# Patient Record
Sex: Female | Born: 2000 | Race: White | Hispanic: No | Marital: Single | Smoking: Never smoker
Health system: Southern US, Community
[De-identification: ages and names within clinical notes are randomized; demographics above are authoritative.]

## PROBLEM LIST (undated history)

## (undated) DIAGNOSIS — F840 Autistic disorder: Secondary | ICD-10-CM

## (undated) DIAGNOSIS — R569 Unspecified convulsions: Secondary | ICD-10-CM

## (undated) DIAGNOSIS — G809 Cerebral palsy, unspecified: Secondary | ICD-10-CM

## (undated) DIAGNOSIS — Z87898 Personal history of other specified conditions: Secondary | ICD-10-CM

## (undated) DIAGNOSIS — I619 Nontraumatic intracerebral hemorrhage, unspecified: Secondary | ICD-10-CM

## (undated) DIAGNOSIS — R51 Headache: Secondary | ICD-10-CM

## (undated) DIAGNOSIS — F419 Anxiety disorder, unspecified: Secondary | ICD-10-CM

## (undated) HISTORY — DX: Headache: R51

## (undated) HISTORY — DX: Autistic disorder: F84.0

## (undated) HISTORY — DX: Personal history of other specified conditions: Z87.898

## (undated) HISTORY — DX: Nontraumatic intracerebral hemorrhage, unspecified: I61.9

## (undated) HISTORY — PX: BOTOX INJECTION: SHX5754

## (undated) HISTORY — DX: Unspecified convulsions: R56.9

## (undated) HISTORY — DX: Anxiety disorder, unspecified: F41.9

---
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
## 2022-01-05 ENCOUNTER — Other Ambulatory Visit (INDEPENDENT_AMBULATORY_CARE_PROVIDER_SITE_OTHER): Payer: Self-pay | Admitting: Family

## 2022-01-05 DIAGNOSIS — F411 Generalized anxiety disorder: Secondary | ICD-10-CM

## 2022-03-11 NOTE — Progress Notes (Signed)
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 [name] and has remained seizure free since July 2013. She has a Information systems manager. [name] for anxiety, as well as Clonidine and Trazodone for insomnia.   Today's concerns: Has remained seizure free Has frequent headaches and stomach pains, thought to be stress related. Has upcoming appointment with GI for evaluation In first year of college. 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 Bonnie Simon has been otherwise generally healthy since she was last seen. No health concerns today other than previously mentioned.  Review of systems: Please see HPI for neurologic and other pertinent review of systems. Otherwise all other systems were reviewed and were negative.  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   Partial epilepsy with  impairment of consciousness (HCC) 11/05/2012   Episodic tension-type headache, not intractable 11/05/2012   Expressive language disorder 11/05/2012   Delayed milestones 11/05/2012   Neonatal intraventricular hemorrhage 11/05/2012   Encounter for long-term (current) use of high-risk medication 11/05/2012     Past Medical History:  Diagnosis Date   Anxiety    Headache(784.0)    History of prematurity 2004   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 Kennon Portela at [facility]     Family history: family history includes Heart Problems in her paternal grandfather.   Social history: Social History   Socioeconomic History   Marital status: Single    Spouse name: Not on file   Number of children: Not on file   Years of education: Not on file   Highest education level: Not on file  Occupational History   Not on file  Tobacco Use   Smoking status: Never    Passive exposure: Yes   Smokeless tobacco: Never  Substance and Sexual Activity   Alcohol use: No   Drug use: No   Sexual activity: Never  Other Topics Concern   Not on file  Social History Narrative   Page is a12th grade student.   She attends [employer].    Bonnie Simon enjoys playing marching band, basketball, soccer, and watching TV.   Demetra lives with her parents. She has an adult sister that does not live at home.   Social Determinants of Health   Financial Resource Strain: Not on file  Food Insecurity: Not on file  Transportation Needs: Not on file  Physical Activity: Not on file  Stress: Not on file  Social Connections: Not on file  Intimate Partner Violence: Not on file    Past/failed meds:  Allergies: Allergies  Allergen Reactions   Amoxicillin Rash    Immunizations: Immunization  History  Administered Date(s) Administered   PFIZER(Purple Top)SARS-COV-2 Vaccination 10/16/2019    Diagnostics/Screenings: Copied from previous record: 09/13/2013 - rEEG - This is a abnormal record with the patient awake.  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  Bonnie Simon, M.D.   05/19/2007 - rEEG - Abnormal EEG on the basis of slowing over the right hemisphere, which is indicative of underlying structural and/or vascular  abnormality and would correlate with the patient's left hemiparesis. This may also be a result of a postictal change from a right brain  signature seizure. Bonnie Simon, M.D.   07/22/2006 - CT Head wo contrast - Patient had a remote periventricular hemorrhage with subsequent dilatation of the right lateral ventricle appearring similar to prior MR. The present exam is motion degraded without obvious intracranial hemorrhage or new mass identified.  Followup MR without motion may be considered for further delineation for investigation of seizure focus when the patient is able to cooperate or be properly sedated.    01/2017 - diagnosis of autism spectrum disorder by Dr Lurline Hare.  Physical Exam: BP 112/80 (BP Location: Right Arm, Patient Position: Sitting, Cuff Size: Normal)   Pulse 80   Ht 4' 10.86" (1.495 m)   Wt 110 lb 12.8 oz (50.3 kg)   LMP 02/28/2022 (Exact Date)   BMI 22.49 kg/m   General: Well developed, well nourished adolescent girl, seated on exam table, in no evident distress Head: Head normocephalic and atraumatic.  Oropharynx benign. Neck: Supple Cardiovascular: Regular rate and rhythm, no murmurs Respiratory: Breath sounds clear to auscultation Musculoskeletal: Mild neuromuscular scoliosis. Has left hemiparesis with hemiatrophy Skin: No rashes or neurocutaneous lesions  Neurologic Exam Mental Status: Awake and fully alert.  Oriented to place and time.   Recent and remote memory intact.  Attention span, concentration, and fund of knowledge appropriate.  Tearful and upset during discussion regarding her career goals. Cranial Nerves: Fundoscopic exam reveals sharp disc margins.  Pupils equal, briskly reactive to light.  Extraocular movements full without nystagmus. Hearing intact and symmetric to whisper.  Facial sensation intact.  Face tongue, palate move normally and symmetrically.  Motor: Normal bulk, tone and strength on the right, hemiparesis on the left with the left hand fisted. She is able to use the left hand and arm has a helper hand. Sensory: Intact to touch and temperature in all extremities.  Coordination: Rapid alternating movements and finger to nose normal on the right, clumsy on the left. Romberg negative. Gait and Station: Arises from chair without difficulty.  Stance is normal. Gait demonstrates mild left hemiparetic gait with decreased left arm swing and slight circumduction of the left leg. Able to heel, toe and tandem walk with mild clumsiness.  Reflexes: diminished on the right, 1+ on the right  Impression: Generalized convulsive epilepsy ([place]) - Plan: carbamazepine (TEGRETOL) 100 MG chewable tablet  Partial epilepsy with impairment of consciousness ([place]) - Plan: carbamazepine (TEGRETOL) 100 MG chewable tablet  Generalized anxiety disorder  Insomnia due to anxiety and fear - Plan: cloNIDine (CATAPRES) 0.1 MG tablet, traZODone (DESYREL) 50 MG tablet  Congenital hemiplegia (HCC)  Episodic tension-type headache,  not intractable  Autism spectrum disorder  Intraventricular hemorrhage of newborn, grade IV  Intraparenchymal hemorrhage of brain [facility])    Recommendations for plan of care: The patient's previous Epic records were reviewed. No recent diagnostic studies to be reviewed with the patient.  Plan until next visit: Continue medications as prescribed  I will investigate physical requirements for the health care  careers that you are interested in and call you in a week or so.  Be sure to keep the appointment with GI as scheduled Call if seizures occur or if you have any questions. Return in about 6 months (around 09/10/2022).  The medication list was reviewed and reconciled. No changes were made in the prescribed medications today. A complete medication list was provided to the patient.  Allergies as of 03/12/2022       Reactions   Amoxicillin Rash        Medication List        Accurate as of March 12, 2022 11:59 PM. If you have any questions, ask your nurse or doctor.          carbamazepine 100 MG chewable tablet Commonly known as: TEGRETOL CHEW 2 TABLETS BY MOUTH IN THE MORNING, 2 TABLETS IN THE AFTERNOON, AND 3 TABLETS AT BEDTIME.   cloNIDine 0.1 MG tablet Commonly known as: CATAPRES TAKE 1 AND 1/2 TABLETS BY MOUTH 30 MINUTES BEFORE BEDTIME.   diazepam 10 MG Gel Commonly known as: DIASTAT ACUDIAL Place 7.5 mg rectally once. Reported on 07/02/2015   FLUoxetine 20 MG/5ML solution Commonly known as: PROZAC TAKE 4 MLS BY MOUTH ONCE DAILY.   ibuprofen 200 MG tablet Commonly known as: ADVIL Take 1 tablet at onset of headache, may repeat in 6 hours if needed.   Melatonin 10 MG Tabs Take 1 tablet at bedtime   traZODone 50 MG tablet Commonly known as: DESYREL TAKE 1 & 1/2 TABLETS BY MOUTH 30 MINS BEFORE BEDTIME.      Total time spent with the patient was 40 minutes, of which 50% or more was spent in counseling and coordination of care.  Elveria Rising NP-C [place] [facility] [address], [address] [place], [place] [address] Ph. [phone] Fax [phone]

## 2022-03-12 ENCOUNTER — Encounter (INDEPENDENT_AMBULATORY_CARE_PROVIDER_SITE_OTHER): Payer: Self-pay | Admitting: Family

## 2022-03-12 ENCOUNTER — Ambulatory Visit (INDEPENDENT_AMBULATORY_CARE_PROVIDER_SITE_OTHER): Payer: BC Managed Care – PPO | Admitting: Family

## 2022-03-12 VITALS — BP 112/80 | HR 80 | Ht 58.86 in | Wt 110.8 lb

## 2022-03-12 DIAGNOSIS — I619 Nontraumatic intracerebral hemorrhage, unspecified: Secondary | ICD-10-CM

## 2022-03-12 DIAGNOSIS — G40209 Localization-related (focal) (partial) symptomatic epilepsy and epileptic syndromes with complex partial seizures, not intractable, without status epilepticus: Secondary | ICD-10-CM | POA: Diagnosis not present

## 2022-03-12 DIAGNOSIS — F5105 Insomnia due to other mental disorder: Secondary | ICD-10-CM | POA: Diagnosis not present

## 2022-03-12 DIAGNOSIS — F840 Autistic disorder: Secondary | ICD-10-CM

## 2022-03-12 DIAGNOSIS — G808 Other cerebral palsy: Secondary | ICD-10-CM

## 2022-03-12 DIAGNOSIS — G40309 Generalized idiopathic epilepsy and epileptic syndromes, not intractable, without status epilepticus: Secondary | ICD-10-CM | POA: Diagnosis not present

## 2022-03-12 DIAGNOSIS — F411 Generalized anxiety disorder: Secondary | ICD-10-CM | POA: Diagnosis not present

## 2022-03-12 DIAGNOSIS — G44219 Episodic tension-type headache, not intractable: Secondary | ICD-10-CM

## 2022-03-12 DIAGNOSIS — F409 Phobic anxiety disorder, unspecified: Secondary | ICD-10-CM

## 2022-03-14 ENCOUNTER — Encounter (INDEPENDENT_AMBULATORY_CARE_PROVIDER_SITE_OTHER): Payer: Self-pay | Admitting: Family

## 2022-03-14 MED ORDER — TRAZODONE HCL 50 MG PO TABS: ORAL_TABLET | ORAL | 5 refills | Status: DC

## 2022-03-14 MED ORDER — CLONIDINE HCL 0.1 MG PO TABS: ORAL_TABLET | ORAL | 1 refills | Status: DC

## 2022-03-14 MED ORDER — CARBAMAZEPINE 100 MG PO CHEW: CHEWABLE_TABLET | ORAL | 5 refills | Status: DC

## 2022-03-14 NOTE — Patient Instructions (Signed)
It was a pleasure to see you today!  Instructions for you until your next appointment are as follows: Continue taking your medications as prescribed I will do some investigation about physical requirements for the careers you are interested in and will call you in a week or so.  Be sure to keep the GI appointment for your stomach pain Please sign up for MyChart if you have not done so. Please plan to return for follow up in 6 months or sooner if needed.   Feel free to contact our office during normal business hours at [phone] with questions or concerns. If there is no answer or the call is outside business hours, please leave a message and our clinic staff will call you back within the next business day.  If you have an urgent concern, please stay on the line for our after-hours answering service and ask for the on-call neurologist.     I also encourage you to use MyChart to communicate with me more directly. If you have not yet signed up for MyChart within [facility], the front desk staff can help you. However, please note that this inbox is NOT monitored on nights or weekends, and response can take up to 2 business days.  Urgent matters should be discussed with the on-call pediatric neurologist.   At [facility], we are committed to providing exceptional care. You will receive a patient satisfaction survey through text or email regarding your visit today. Your opinion is important to me. Comments are appreciated.

## 2022-03-18 DIAGNOSIS — R109 Unspecified abdominal pain: Secondary | ICD-10-CM | POA: Diagnosis not present

## 2022-03-18 DIAGNOSIS — R12 Heartburn: Secondary | ICD-10-CM | POA: Diagnosis not present

## 2022-03-19 DIAGNOSIS — R109 Unspecified abdominal pain: Secondary | ICD-10-CM | POA: Diagnosis not present

## 2022-04-02 DIAGNOSIS — N200 Calculus of kidney: Secondary | ICD-10-CM | POA: Diagnosis not present

## 2022-04-03 ENCOUNTER — Other Ambulatory Visit (INDEPENDENT_AMBULATORY_CARE_PROVIDER_SITE_OTHER): Payer: Self-pay | Admitting: Family

## 2022-04-03 DIAGNOSIS — F409 Phobic anxiety disorder, unspecified: Secondary | ICD-10-CM

## 2022-04-08 DIAGNOSIS — N200 Calculus of kidney: Secondary | ICD-10-CM | POA: Diagnosis not present

## 2022-04-15 DIAGNOSIS — N200 Calculus of kidney: Secondary | ICD-10-CM | POA: Diagnosis not present

## 2022-04-16 DIAGNOSIS — R109 Unspecified abdominal pain: Secondary | ICD-10-CM | POA: Diagnosis not present

## 2022-04-22 DIAGNOSIS — N200 Calculus of kidney: Secondary | ICD-10-CM | POA: Diagnosis not present

## 2022-05-05 DIAGNOSIS — N200 Calculus of kidney: Secondary | ICD-10-CM | POA: Diagnosis not present

## 2022-05-12 DIAGNOSIS — R12 Heartburn: Secondary | ICD-10-CM | POA: Diagnosis not present

## 2022-05-12 DIAGNOSIS — R1030 Lower abdominal pain, unspecified: Secondary | ICD-10-CM | POA: Diagnosis not present

## 2022-05-24 DIAGNOSIS — J069 Acute upper respiratory infection, unspecified: Secondary | ICD-10-CM | POA: Diagnosis not present

## 2022-07-01 DIAGNOSIS — N200 Calculus of kidney: Secondary | ICD-10-CM | POA: Diagnosis not present

## 2022-07-07 ENCOUNTER — Ambulatory Visit: Payer: BC Managed Care – PPO | Admitting: Internal Medicine

## 2022-07-07 ENCOUNTER — Encounter: Payer: Self-pay | Admitting: Internal Medicine

## 2022-07-07 VITALS — BP 132/84 | HR 95 | Ht 58.5 in | Wt 109.4 lb

## 2022-07-07 DIAGNOSIS — Z7689 Persons encountering health services in other specified circumstances: Secondary | ICD-10-CM | POA: Diagnosis not present

## 2022-07-07 DIAGNOSIS — G40309 Generalized idiopathic epilepsy and epileptic syndromes, not intractable, without status epilepticus: Secondary | ICD-10-CM | POA: Diagnosis not present

## 2022-07-07 DIAGNOSIS — F5105 Insomnia due to other mental disorder: Secondary | ICD-10-CM | POA: Diagnosis not present

## 2022-07-07 DIAGNOSIS — F411 Generalized anxiety disorder: Secondary | ICD-10-CM | POA: Diagnosis not present

## 2022-07-07 DIAGNOSIS — Z1159 Encounter for screening for other viral diseases: Secondary | ICD-10-CM | POA: Diagnosis not present

## 2022-07-07 DIAGNOSIS — K219 Gastro-esophageal reflux disease without esophagitis: Secondary | ICD-10-CM

## 2022-07-07 DIAGNOSIS — K589 Irritable bowel syndrome without diarrhea: Secondary | ICD-10-CM | POA: Insufficient documentation

## 2022-07-07 DIAGNOSIS — F409 Phobic anxiety disorder, unspecified: Secondary | ICD-10-CM

## 2022-07-07 DIAGNOSIS — Z114 Encounter for screening for human immunodeficiency virus [HIV]: Secondary | ICD-10-CM | POA: Diagnosis not present

## 2022-07-07 DIAGNOSIS — N200 Calculus of kidney: Secondary | ICD-10-CM

## 2022-07-07 NOTE — Assessment & Plan Note (Signed)
Questionable symptoms of IBS, she has uncontrolled anxiety, has chronic abdominal pain and had diarrhea - resolved now Was taking Bentyl, but was not effective, DC Bentyl as it can lead to worsening of constipation Followed by GI

## 2022-07-07 NOTE — Assessment & Plan Note (Signed)
Uncontrolled with Prozac and trazodone currently Needs to take Prozac in AM to avoid insomnia from SSRI Currently managed by pediatric neurology

## 2022-07-07 NOTE — Progress Notes (Signed)
New Patient Office Visit  Subjective:  Patient ID: Bonnie Simon, female    DOB: 07-12-2000  Age: 22 y.o. MRN: [id number]  CC:  Chief Complaint  Patient presents with   Establish Care    HPI Bonnie Simon is a 22 y.o. female with past medical history of generalized convulsive epilepsy, neonatal intraventricular hemorrhage, GAD, GERD and nephrolithiasis who presents for establishing care.  She you used to see pediatrics clinic for her routine medical care.  She sees pediatric neurology for history of generalized convulsive epilepsy and GAD.  She is currently on Tegretol for epilepsy.  She takes Prozac for GAD.  She takes clonidine and trazodone for insomnia.  She reports chronic and anxiety.  She is currently enrolled in radiology tech school.  She still has insomnia, has difficulty initiating and maintaining sleep.  GERD and abdominal pain: She has had GI evaluation for chronic abdominal pain and bloating.  She has been taking omeprazole, which has resolved her episodes of epigastric/chest pain now.  Denies any dysphagia or odynophagia currently.  She has intermittent nausea, but denies vomiting.  She was also given Bentyl for abdominal pain.  She had diarrhea, which was chronic, but has resolved now.  She denies any melena or hematochezia.  She reports no improvement with Bentyl today.  She has had back discomfort when she has abdominal pain as well.  Denies any injury.  She had CT abdomen done in 02/24, which also showed bilateral nephrolithiasis.  She has seen urologist for it.  Denies any dysuria, hematuria or urinary hesitancy or resistance currently.     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 Kennon Portela at [facility]    Family History  Problem Relation Age of Onset   Heart Problems Paternal Grandfather        Died at 35    Social History   Socioeconomic History   Marital status: Single    Spouse name: Not on file   Number of children: Not on file   Years of education: Not on file   Highest education level: Not on file  Occupational History   Not on file  Tobacco Use   Smoking status: Never    Passive exposure: Yes   Smokeless tobacco: Never  Substance and Sexual Activity   Alcohol use: No   Drug use: No   Sexual activity: Never  Other Topics Concern   Not on file  Social History Narrative   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   She attends [employer].    Bonnie Simon enjoys playing marching band, basketball, soccer, and watching TV.   Bonnie Simon lives with her parents. She has an adult sister that does not live at home.   Social Determinants of Health   Financial Resource Strain: Not on file  Food Insecurity: Not on file  Transportation Needs: Not on file  Physical Activity: Not on file  Stress: Not on file  Social Connections: Not on file  Intimate Partner Violence: Not on file    ROS Review of Systems  Constitutional:  Negative for chills and fever.  HENT:  Negative for  congestion, sinus pressure, sinus pain and sore throat.   Eyes:  Negative for pain and discharge.  Respiratory:  Negative for cough and shortness of breath.   Cardiovascular:  Negative for chest pain and palpitations.  Gastrointestinal:  Positive for abdominal pain and nausea. Negative for diarrhea and vomiting.  Endocrine: Negative for polydipsia and polyuria.  Genitourinary:  Negative for dysuria and hematuria.  Musculoskeletal:  Positive for myalgias. Negative for neck pain and neck stiffness.  Skin:  Negative for rash.  Neurological:  Negative for dizziness and weakness.  Psychiatric/Behavioral:  Positive for sleep disturbance. Negative for  agitation and behavioral problems. The patient is nervous/anxious.     Objective:   Today's Vitals: BP 132/84 (BP Location: Right Arm, Patient Position: Sitting, Cuff Size: Normal)   Pulse 95   Ht 4' 10.5" (1.486 m)   Wt 109 lb 6.4 oz (49.6 kg)   SpO2 97%   BMI 22.48 kg/m   Physical Exam Vitals reviewed.  Constitutional:      General: She is not in acute distress.    Appearance: She is not diaphoretic.  HENT:     Head: Normocephalic and atraumatic.     Nose: Nose normal.     Mouth/Throat:     Mouth: Mucous membranes are moist.  Eyes:     General: No scleral icterus.    Extraocular Movements: Extraocular movements intact.  Cardiovascular:     Rate and Rhythm: Normal rate and regular rhythm.     Pulses: Normal pulses.     Heart sounds: No murmur heard. Pulmonary:     Breath sounds: Normal breath sounds. No wheezing or rales.  Abdominal:     Palpations: Abdomen is soft.     Tenderness: There is no abdominal tenderness.  Musculoskeletal:     Cervical back: Neck supple. No tenderness.     Right lower leg: No edema.     Left lower leg: No edema.  Skin:    General: Skin is warm.     Findings: No rash.  Neurological:     General: No focal deficit present.     Mental Status: She is alert and oriented to person, place, and time.  Psychiatric:        Mood and Affect: Mood normal.        Behavior: Behavior normal.     Assessment & Plan:   Problem List Items Addressed This Visit       Digestive   Gastroesophageal reflux disease without esophagitis    Well controlled with omeprazole now Has seen GI specialist at [facility]      Relevant Medications   omeprazole (PRILOSEC) 40 MG capsule   Irritable bowel syndrome    Questionable symptoms of IBS, she has uncontrolled anxiety, has chronic abdominal pain and had diarrhea - resolved now Was taking Bentyl, but was not effective, DC Bentyl as it can lead to worsening of constipation Followed by GI      Relevant  Medications   omeprazole (PRILOSEC) 40 MG capsule     Nervous and Auditory   Generalized convulsive epilepsy (HCC) - Primary    Well controlled with Tegretol Denies any recent seizure like activity Followed by pediatric neurology, she prefers to stay with her current neurologist.       Relevant Orders   CBC   Basic Metabolic Panel (BMET)     Genitourinary   Nephrolithiasis    Has bilateral nephrolithiasis, likely incidental finding Has seen urologist at [facility] Needs to  improve hydration and follow low-salt diet        Other   Generalized anxiety disorder    Uncontrolled with Prozac and trazodone currently Needs to take Prozac in AM to avoid insomnia from SSRI Currently managed by pediatric neurology      Relevant Orders   TSH + free T4   Insomnia due to anxiety and fear    On trazodone 75 mg qHS and clonidine 0.15 mg qHS      Other Visit Diagnoses     Encounter to establish care       Screening for HIV (human immunodeficiency virus)       Relevant Orders   HIV antibody (with reflex)   Need for hepatitis C screening test       Relevant Orders   Hepatitis C Antibody       Outpatient Encounter Medications as of 07/07/2022  Medication Sig   carbamazepine (TEGRETOL) 100 MG chewable tablet CHEW 2 TABLETS BY MOUTH IN THE MORNING, 2 TABLETS IN THE AFTERNOON, AND 3 TABLETS AT BEDTIME.   cloNIDine (CATAPRES) 0.1 MG tablet TAKE 1 AND 1/2 TABLETS BY MOUTH 30 MINUTES BEFORE BEDTIME.   FLUoxetine (PROZAC) 20 MG/5ML solution TAKE 4 MLS BY MOUTH ONCE DAILY.   ibuprofen (ADVIL) 200 MG tablet Take 1 tablet at onset of headache, may repeat in 6 hours if needed.   Melatonin 10 MG TABS Take 1 tablet at bedtime   omeprazole (PRILOSEC) 40 MG capsule Take 40 mg by mouth every morning.   traZODone (DESYREL) 50 MG tablet TAKE 1 & 1/2 TABLETS BY MOUTH 30 MINS BEFORE BEDTIME.   [DISCONTINUED] dicyclomine (BENTYL) 10 MG capsule TAKE 1 CAPSULE BY MOUTH 30 MINUTES BEFORE MEALS AND AT  BEDTIME.   [DISCONTINUED] diazepam (DIASTAT ACUDIAL) 10 MG GEL Place 7.5 mg rectally once. Reported on 07/02/2015 (Patient not taking: Reported on 07/07/2022)   No facility-administered encounter medications on file as of 07/07/2022.    Follow-up: Return in about 6 months (around 01/07/2023) for GAD and seizures.   Anabel Halon, MD

## 2022-07-07 NOTE — Assessment & Plan Note (Signed)
Has bilateral nephrolithiasis, likely incidental finding Has seen urologist at [facility] Needs to improve hydration and follow low-salt diet

## 2022-07-07 NOTE — Assessment & Plan Note (Signed)
Well controlled with Tegretol Denies any recent seizure like activity Followed by pediatric neurology, she prefers to stay with her current neurologist.

## 2022-07-07 NOTE — Assessment & Plan Note (Signed)
On trazodone 75 mg qHS and clonidine 0.15 mg qHS

## 2022-07-07 NOTE — Patient Instructions (Signed)
Please stop taking Bentyl.  Please take Fluoxetine in the morning.  Please continue taking other medications as prescribed.  Please maintain simple sleep hygiene. - Maintain dark and non-noisy environment in the bedroom. - Please use the bedroom for sleep and sexual activity only. - Do not use electronic devices in the bedroom. - Please take dinner at least 2 hours before bedtime. - Please avoid caffeinated products in the evening, including coffee, soft drinks. - Please try to maintain the regular sleep-wake cycle - Go to bed and wake up at the same time.

## 2022-07-07 NOTE — Assessment & Plan Note (Addendum)
Well controlled with omeprazole now Has seen GI specialist at [facility]

## 2022-07-08 LAB — CBC
Hematocrit: 40.3 % (ref 34.0–46.6)
Hemoglobin: 14.1 g/dL (ref 11.1–15.9)
MCH: 33.4 pg — ABNORMAL HIGH (ref 26.6–33.0)
MCHC: 35 g/dL (ref 31.5–35.7)
MCV: 96 fL (ref 79–97)
Platelets: 259 10*3/uL (ref 150–450)
RBC: 4.22 x10E6/uL (ref 3.77–5.28)
RDW: 11.6 % — ABNORMAL LOW (ref 11.7–15.4)
WBC: 5.5 10*3/uL (ref 3.4–10.8)

## 2022-07-08 LAB — BASIC METABOLIC PANEL
BUN/Creatinine Ratio: 10 (ref 9–23)
BUN: 8 mg/dL (ref 6–20)
CO2: 25 mmol/L (ref 20–29)
Calcium: 9.8 mg/dL (ref 8.7–10.2)
Chloride: 101 mmol/L (ref 96–106)
Creatinine, Ser: 0.79 mg/dL (ref 0.57–1.00)
Glucose: 96 mg/dL (ref 70–99)
Potassium: 4.4 mmol/L (ref 3.5–5.2)
Sodium: 139 mmol/L (ref 134–144)
eGFR: 110 mL/min/{1.73_m2} (ref >59)

## 2022-07-08 LAB — HEPATITIS C ANTIBODY: Hep C Virus Ab: NONREACTIVE

## 2022-07-08 LAB — TSH+FREE T4
Free T4: 0.93 ng/dL (ref 0.93–1.60)
TSH: 1.48 u[IU]/mL (ref 0.450–4.500)

## 2022-07-08 LAB — HIV ANTIBODY (ROUTINE TESTING W REFLEX): HIV Screen 4th Generation wRfx: NONREACTIVE

## 2022-07-14 DIAGNOSIS — K219 Gastro-esophageal reflux disease without esophagitis: Secondary | ICD-10-CM | POA: Diagnosis not present

## 2022-07-14 DIAGNOSIS — E739 Lactose intolerance, unspecified: Secondary | ICD-10-CM | POA: Diagnosis not present

## 2022-07-14 DIAGNOSIS — R1030 Lower abdominal pain, unspecified: Secondary | ICD-10-CM | POA: Diagnosis not present

## 2022-08-04 DIAGNOSIS — N200 Calculus of kidney: Secondary | ICD-10-CM | POA: Diagnosis not present

## 2022-09-19 DIAGNOSIS — N200 Calculus of kidney: Secondary | ICD-10-CM | POA: Diagnosis not present

## 2022-09-21 ENCOUNTER — Other Ambulatory Visit (INDEPENDENT_AMBULATORY_CARE_PROVIDER_SITE_OTHER): Payer: Self-pay | Admitting: Family

## 2022-09-21 DIAGNOSIS — F5105 Insomnia due to other mental disorder: Secondary | ICD-10-CM

## 2022-09-29 DIAGNOSIS — N200 Calculus of kidney: Secondary | ICD-10-CM | POA: Diagnosis not present

## 2022-10-03 ENCOUNTER — Other Ambulatory Visit (INDEPENDENT_AMBULATORY_CARE_PROVIDER_SITE_OTHER): Payer: Self-pay | Admitting: Family

## 2022-10-03 DIAGNOSIS — G40209 Localization-related (focal) (partial) symptomatic epilepsy and epileptic syndromes with complex partial seizures, not intractable, without status epilepticus: Secondary | ICD-10-CM

## 2022-10-03 DIAGNOSIS — G40309 Generalized idiopathic epilepsy and epileptic syndromes, not intractable, without status epilepticus: Secondary | ICD-10-CM

## 2022-10-06 NOTE — Telephone Encounter (Signed)
Last OV 03/12/2022 Next OV 10/16/2022 Last Rx written 03/14/2022 with 5 refills last filled 09/02/2022

## 2022-10-13 ENCOUNTER — Other Ambulatory Visit (INDEPENDENT_AMBULATORY_CARE_PROVIDER_SITE_OTHER): Payer: Self-pay | Admitting: Family

## 2022-10-13 DIAGNOSIS — F409 Phobic anxiety disorder, unspecified: Secondary | ICD-10-CM

## 2022-10-16 ENCOUNTER — Encounter (INDEPENDENT_AMBULATORY_CARE_PROVIDER_SITE_OTHER): Payer: Self-pay | Admitting: Family

## 2022-10-16 ENCOUNTER — Ambulatory Visit (INDEPENDENT_AMBULATORY_CARE_PROVIDER_SITE_OTHER): Payer: BC Managed Care – PPO | Admitting: Family

## 2022-10-16 VITALS — BP 120/78 | HR 68 | Ht 58.82 in | Wt 102.4 lb

## 2022-10-16 DIAGNOSIS — G40209 Localization-related (focal) (partial) symptomatic epilepsy and epileptic syndromes with complex partial seizures, not intractable, without status epilepticus: Secondary | ICD-10-CM

## 2022-10-16 DIAGNOSIS — F840 Autistic disorder: Secondary | ICD-10-CM

## 2022-10-16 DIAGNOSIS — G808 Other cerebral palsy: Secondary | ICD-10-CM | POA: Diagnosis not present

## 2022-10-16 DIAGNOSIS — G40309 Generalized idiopathic epilepsy and epileptic syndromes, not intractable, without status epilepticus: Secondary | ICD-10-CM | POA: Diagnosis not present

## 2022-10-16 DIAGNOSIS — N200 Calculus of kidney: Secondary | ICD-10-CM

## 2022-10-16 DIAGNOSIS — Z5689 Other problems related to employment: Secondary | ICD-10-CM

## 2022-10-16 DIAGNOSIS — G44219 Episodic tension-type headache, not intractable: Secondary | ICD-10-CM

## 2022-10-16 DIAGNOSIS — F411 Generalized anxiety disorder: Secondary | ICD-10-CM

## 2022-10-16 NOTE — Progress Notes (Signed)
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 [name] and has remained seizure free since July 2013. She has a Information systems manager. [name] for anxiety, as well as Clonidine and Trazodone for insomnia.   Today's concerns: She has remained seizure free She has been diagnosed with bilateral renal stones and will be having surgery in September to remove them. Her provider has suggested that Carbamazepine may be responsible for stone formation.  Bonnie Simon is in college and is struggling with career choices. 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 She is enrolled in a radiology program and is unhappy in this area. She is frustrated with limitations related to the left hemiplegia. Mom is also concerned about social limitations related to her anxiety and her autism.  Bonnie Simon has been otherwise generally healthy since she was last seen. No health concerns today other than previously mentioned.  Review of systems: Please see HPI for neurologic and other pertinent review of systems. Otherwise all other systems were reviewed and were negative.  Problem List: Patient Active Problem List   Diagnosis Date Noted   Nephrolithiasis 07/07/2022   Gastroesophageal  reflux disease without esophagitis 07/07/2022   Irritable bowel syndrome 07/07/2022   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   Autism spectrum disorder 02/20/2017   Generalized anxiety disorder 07/05/2014   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   Partial epilepsy with impairment of consciousness (HCC) 11/05/2012   Episodic tension-type headache, not intractable 11/05/2012   Neonatal intraventricular hemorrhage 11/05/2012     Past Medical History:  Diagnosis Date   Anxiety    Autism    Delayed milestones 11/05/2012   Headache(784.0)    History of prematurity 2004   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 Kennon Portela at [facility]    Family history: family history includes Heart Problems in her paternal grandfather.   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   She attends [employer].    Bonnie Simon Agee  enjoys playing marching band, basketball, soccer, and watching TV.   Bonnie Simon lives with her parents. She has an adult sister that does not live at home.   Social Determinants of Health   Financial Resource Strain: Not on file  Food Insecurity: Not on file  Transportation Needs: Not on file  Physical Activity: Not  on file  Stress: Not on file  Social Connections: Unknown (02/18/2022)   Received from [facility], [facility]   Social Network    Social Network: Not on file  Intimate Partner Violence: Unknown (02/18/2022)   Received from [facility], [facility]   HITS    Physically Hurt: Not on file    Insult or Talk Down To: Not on file    Threaten Physical Harm: Not on file    Scream or Curse: Not on file    Past/failed meds:  Allergies: Allergies  Allergen Reactions   Amoxicillin Rash    Immunizations: Immunization History  Administered Date(s) Administered   PFIZER(Purple Top)SARS-COV-2 Vaccination 10/16/2019    Diagnostics/Screenings: Copied from previous record: 09/13/2013 - rEEG - This is a abnormal record with the patient awake.  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  [name], M.D.   05/19/2007 - rEEG - Abnormal EEG on the basis of slowing over the right hemisphere, which is indicative of underlying structural and/or vascular  abnormality and would correlate with the patient's left hemiparesis. This may also be a result of a postictal change from a right brain  signature seizure. [name], M.D.   07/22/2006 - CT Head wo contrast - Patient had a remote periventricular hemorrhage with subsequent dilatation of the right lateral ventricle appearring similar to prior MR. The present exam is motion degraded without obvious intracranial hemorrhage or new mass identified.  Followup MR without motion may be considered for further delineation for investigation of seizure focus when the patient is able to cooperate or be properly sedated.    01/2017 - diagnosis of autism spectrum disorder by Dr Reggy Eye.  Physical Exam: BP 120/78   Pulse 68   Ht 4' 10.82" (1.494 m)   Wt 102 lb 6.4 oz (46.4 kg)   BMI 20.81 kg/m   General: Well developed, well nourished young woman, seated  on exam table, in no evident distress Head: Head normocephalic and atraumatic.  Oropharynx benign. Neck: Supple Cardiovascular: Regular rate and rhythm, no murmurs Respiratory: Breath sounds clear to auscultation Musculoskeletal: Mild neuromuscular scoliosis. Has left hemiparesis with hemiatrophy.  Skin: No rashes or neurocutaneous lesions  Neurologic Exam Mental Status: Awake and fully alert.  Oriented to place and time.  Recent and remote memory intact.  Attention span, concentration, and fund of knowledge appropriate.  Tearful and angry during the discussion about her career goals.  Cranial Nerves: Fundoscopic exam reveals sharp disc margins.  Pupils equal, briskly reactive to light.  Extraocular movements full without nystagmus. Hearing intact and symmetric to whisper.  Facial sensation intact.  Face tongue, palate move normally and symmetrically. Shoulder shrug normal Motor: Normal bulk, tone and strength on the right. Hemiparesis on the left with the hand fisted. Is able to use the left arm and hand as a helper hand.  Sensory: Intact to touch and temperature in all extremities.  Coordination: Rapid alternating movements normal on the right, clumsy on the left.  Gait and Station: Arises from chair without difficulty.  Stance is normal. Gait demonstrates mild left hemiparetic gait.  Impression:  Partial epilepsy with impairment of consciousness (HCC)  Congenital hemiplegia (HCC)  Generalized convulsive epilepsy (HCC)  Episodic tension-type headache, not intractable  Intraventricular hemorrhage of newborn, grade IV  Generalized anxiety disorder  Autism spectrum disorder  Nephrolithiasis  Career choice problem   Recommendations for plan of care: The patient's previous Epic records were reviewed. No recent diagnostic studies to be reviewed with the patient.  Plan until next visit: Continue medications as prescribed for now. I am reluctant to make changes in the medication at  this time since you have been seizure free for several years.  Call for questions or concerns Return in about 6 months (around 04/18/2023).  The medication list was reviewed and reconciled. No changes were made in the prescribed medications today. A complete medication list was provided to the patient.  Allergies as of 10/16/2022       Reactions   Amoxicillin Rash        Medication List        Accurate as of October 16, 2022 11:59 PM. If you have any questions, ask your nurse or doctor.          carbamazepine 100 MG chewable tablet Commonly known as: TEGRETOL CHEW 2 TABLETS BY MOUTH IN THE MORNING, 2 TABLETS IN THE AFTERNOON, AND 3 TABLETS AT BEDTIME.   cloNIDine 0.1 MG tablet Commonly known as: CATAPRES TAKE 1 AND 1/2 TABLETS BY MOUTH 30 MINUTES BEFORE BEDTIME.   FLUoxetine 20 MG/5ML solution Commonly known as: PROZAC TAKE 4 MLS BY MOUTH ONCE DAILY.   hydrochlorothiazide 12.5 MG tablet Commonly known as: HYDRODIURIL Take 1 tablet by mouth daily.   ibuprofen 200 MG tablet Commonly known as: ADVIL Take 1 tablet at onset of headache, may repeat in 6 hours if needed.   Melatonin 10 MG Tabs Take by mouth.   Melatonin 10 MG Tabs Take 1 tablet at bedtime   omeprazole 40 MG capsule Commonly known as: PRILOSEC Take 40 mg by mouth every morning.   potassium citrate 10 MEQ (1080 MG) SR tablet Commonly known as: UROCIT-K Take by mouth.   traZODone 50 MG tablet Commonly known as: DESYREL TAKE 1 & 1/2 TABLETS BY MOUTH 30 MINS BEFORE BEDTIME.      Total time spent with the patient was 30 minutes, of which 50% or more was spent in counseling and coordination of care.  Elveria Rising NP-C [facility] [address] [place], [place] [address] Ph. [phone] Fax [phone]

## 2022-10-16 NOTE — Patient Instructions (Addendum)
It was a pleasure to see you today!  Instructions for you until your next appointment are as follows: Continue your medication as prescribed for now. We may consider changing the medication in the future, based on how the kidney stone tests turn out Please sign up for MyChart if you have not done so. Please plan to return for follow up in 6 months or sooner if needed.  Feel free to contact our office during normal business hours at [phone] with questions or concerns. If there is no answer or the call is outside business hours, please leave a message and our clinic staff will call you back within the next business day.  If you have an urgent concern, please stay on the line for our after-hours answering service and ask for the on-call neurologist.     I also encourage you to use MyChart to communicate with me more directly. If you have not yet signed up for MyChart within [facility], the front desk staff can help you. However, please note that this inbox is NOT monitored on nights or weekends, and response can take up to 2 business days.  Urgent matters should be discussed with the on-call pediatric neurologist.   At [facility], we are committed to providing exceptional care. You will receive a patient satisfaction survey through text or email regarding your visit today. Your opinion is important to me. Comments are appreciated.

## 2022-10-19 ENCOUNTER — Encounter (INDEPENDENT_AMBULATORY_CARE_PROVIDER_SITE_OTHER): Payer: Self-pay | Admitting: Family

## 2022-10-19 DIAGNOSIS — Z5689 Other problems related to employment: Secondary | ICD-10-CM | POA: Insufficient documentation

## 2022-10-21 ENCOUNTER — Other Ambulatory Visit (INDEPENDENT_AMBULATORY_CARE_PROVIDER_SITE_OTHER): Payer: Self-pay | Admitting: Family

## 2022-10-21 DIAGNOSIS — F5105 Insomnia due to other mental disorder: Secondary | ICD-10-CM

## 2022-10-29 ENCOUNTER — Other Ambulatory Visit (INDEPENDENT_AMBULATORY_CARE_PROVIDER_SITE_OTHER): Payer: Self-pay | Admitting: Family

## 2022-10-29 DIAGNOSIS — F411 Generalized anxiety disorder: Secondary | ICD-10-CM

## 2022-11-04 DIAGNOSIS — N200 Calculus of kidney: Secondary | ICD-10-CM | POA: Diagnosis not present

## 2022-11-07 ENCOUNTER — Telehealth: Payer: Self-pay | Admitting: Internal Medicine

## 2022-11-07 NOTE — Telephone Encounter (Signed)
Spoke with patient and patient stated that the dizziness and throwing up had improved. Advised patient to give [place] a call to make appt if things didn't get any better.

## 2022-11-07 NOTE — Telephone Encounter (Signed)
Patient LVM says she has been experiencing dizziness and was throwing up last night. Please advise Thank you

## 2022-11-08 ENCOUNTER — Ambulatory Visit
Admission: EM | Admit: 2022-11-08 | Discharge: 2022-11-08 | Disposition: A | Discharge status: Home / Self Care | Payer: BC Managed Care – PPO | Attending: Family Medicine | Admitting: Family Medicine

## 2022-11-08 DIAGNOSIS — R42 Dizziness and giddiness: Secondary | ICD-10-CM

## 2022-11-08 DIAGNOSIS — E860 Dehydration: Secondary | ICD-10-CM | POA: Diagnosis not present

## 2022-11-08 DIAGNOSIS — R519 Headache, unspecified: Secondary | ICD-10-CM | POA: Diagnosis not present

## 2022-11-08 DIAGNOSIS — R112 Nausea with vomiting, unspecified: Secondary | ICD-10-CM

## 2022-11-08 LAB — POCT URINALYSIS DIP (MANUAL ENTRY)
Glucose, UA: NEGATIVE mg/dL
Nitrite, UA: NEGATIVE
Protein Ur, POC: 30 mg/dL — AB
Spec Grav, UA: 1.025 (ref 1.010–1.025)
Urobilinogen, UA: 0.2 U/dL
pH, UA: 5.5 (ref 5.0–8.0)

## 2022-11-08 LAB — POCT URINE PREGNANCY: Preg Test, Ur: NEGATIVE

## 2022-11-08 LAB — POCT FASTING CBG KUC MANUAL ENTRY: POCT Glucose (KUC): 64 mg/dL — AB (ref 70–99)

## 2022-11-08 MED ORDER — SODIUM CHLORIDE 0.9 % IV BOLUS: 1000.0000 mL | Freq: Once | INTRAVENOUS | Status: DC

## 2022-11-08 MED ORDER — ONDANSETRON 4 MG PO TBDP
4.0000 mg | ORAL_TABLET | Freq: Once | ORAL | Status: AC
  Administered 2022-11-08: 4 mg via ORAL

## 2022-11-08 MED ORDER — ONDANSETRON 4 MG PO TBDP: 4.0000 mg | ORAL_TABLET | Freq: Three times a day (TID) | ORAL | 0 refills | Status: DC | PRN

## 2022-11-08 NOTE — ED Triage Notes (Addendum)
Pt reports she has been dizzy, headache, N/V, and loss of appetite x 3 days

## 2022-11-08 NOTE — ED Notes (Signed)
Attempted IV x2. Unsuccessful attempts in right forearm and right AC. Pt tolerated well. MD aware.

## 2022-11-08 NOTE — Discharge Instructions (Signed)
Please do your best to ensure adequate fluid intake in order to avoid dehydration. If you find that you are unable to tolerate drinking fluids regularly please proceed to the Emergency Department for evaluation. ° ° °

## 2022-11-11 ENCOUNTER — Encounter: Payer: Self-pay | Admitting: Family Medicine

## 2022-11-11 ENCOUNTER — Ambulatory Visit: Payer: BC Managed Care – PPO | Admitting: Family Medicine

## 2022-11-11 VITALS — BP 125/84 | HR 93 | Wt 102.1 lb

## 2022-11-11 DIAGNOSIS — R42 Dizziness and giddiness: Secondary | ICD-10-CM

## 2022-11-11 DIAGNOSIS — B349 Viral infection, unspecified: Secondary | ICD-10-CM | POA: Diagnosis not present

## 2022-11-11 MED ORDER — MECLIZINE HCL 12.5 MG PO TABS: 12.5000 mg | ORAL_TABLET | Freq: Three times a day (TID) | ORAL | 0 refills | Status: DC | PRN

## 2022-11-11 NOTE — Patient Instructions (Addendum)
I appreciate the opportunity to provide care to you today!    Follow up: PCP  Management of Vertigo -Start taking meclizine 12.5 mg up to three times daily as needed for symptoms of dizziness. Administer the medication approximately one hour prior to anticipated onset of symptoms. -Continue using ondansetron (Zofran) as needed for the management of nausea and vomiting. -Increase fluid intake to a minimum of 64 ounces daily to support overall hydration. -Transition between positions slowly to minimize vertigo symptoms. Avoid sudden head movements and maintain a position with your head elevated during sleep to further alleviate vertigo. -Adequate hydration is crucial for maintaining proper inner ear function, as dehydration can exacerbate dizziness.  -Please take Nurtec 75 mg once daily for the abortive treatment of your headache. The maximum allowable dose of Nurtec within a 24-hour period is 75 mg.   Attached with your AVS, you will find valuable resources for self-education. I highly recommend dedicating some time to thoroughly examine them.   Please continue to a heart-healthy diet and increase your physical activities. Try to exercise for at least five days a week.    It was a pleasure to see you and I look forward to continuing to work together on your health and well-being. Please do not hesitate to call the office if you need care or have questions about your care.  In case of emergency, please visit the Emergency Department for urgent care, or contact our clinic at [phone] to schedule an appointment. We're here to help you!   Have a wonderful day and week. With Gratitude, Gilmore Laroche MSN, FNP-BC

## 2022-11-11 NOTE — Progress Notes (Unsigned)
Acute Office Visit  Subjective:    Patient ID: Bonnie Simon, female    DOB: 2001-01-22, 22 y.o.   MRN: [id number]  Chief Complaint  Patient presents with   Dizziness    Pt reports nausea, dizziness and headaches medication not helping from urgent care on 09/07, started feeling dizzy since 11/05/2022.     HPI The patient presents today with complaints of dizziness, headache, nausea, and vomiting. She was seen in the ED on 11/08/2022, where she received IV fluids for dehydration and Zofran for nausea and vomiting. The patient reports that her symptoms have not resolved. She describes dizziness with positional changes and a sensation of the room spinning. While her nausea and vomiting have subsided, she continues to experience dizziness and a throbbing headache, which is accompanied by sensitivity to light and noise. The patient is currently on her menstrual cycle and has been taking over-the-counter Tylenol with minimal relief.  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 Kennon Portela at [facility]    Family History  Problem Relation Age of Onset   Heart Problems Paternal Grandfather        Died at 59    Social History   Socioeconomic History   Marital status: Single    Spouse name: Not on file   Number of children: Not on file   Years of education: Not on file   Highest education level: Not on file  Occupational History   Not on file  Tobacco Use   Smoking status: Never    Passive exposure: Yes   Smokeless tobacco: Never  Substance and Sexual Activity   Alcohol use: No   Drug use: No   Sexual activity: Never  Other Topics Concern   Not on file  Social History Narrative    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   She attends [employer].    Lundon enjoys playing marching band, basketball, soccer, and watching TV.   Larayne lives with her parents. She has an adult sister that does not live at home.   Social Determinants of Health   Financial Resource Strain: Not on file  Food Insecurity: Not on file  Transportation Needs: Not on file  Physical Activity: Not on file  Stress: Not on file  Social Connections: Unknown (02/18/2022)   [facility] from [facility], [facility]   Social Network    Social Network: Not on file  Intimate Partner Violence: Unknown (02/18/2022)   Received from [facility], [facility]   HITS    Physically Hurt: Not on file    Insult or Talk Down To: Not on file    Threaten Physical Harm: Not on file    Scream or Curse: Not on file    Outpatient Medications Prior to Visit  Medication Sig Dispense Refill   carbamazepine (TEGRETOL) 100 MG chewable tablet CHEW 2 TABLETS BY MOUTH IN THE MORNING, 2 TABLETS IN THE AFTERNOON, AND 3 TABLETS AT BEDTIME. 210 tablet 0   cloNIDine (CATAPRES) 0.1 MG tablet TAKE 1 AND 1/2 TABLETS BY MOUTH 30 MINUTES BEFORE BEDTIME. 45 tablet 0   FLUoxetine (PROZAC) 20 MG/5ML solution  TAKE 4 MLS BY MOUTH ONCE DAILY. 360 mL 1   hydrochlorothiazide (HYDRODIURIL) 12.5 MG tablet Take 1 tablet by mouth daily.     ibuprofen (ADVIL) 200 MG tablet Take 1 tablet at onset of headache, may repeat in 6 hours if needed. 30 tablet 0   Melatonin 10 MG TABS Take 1 tablet at bedtime     Melatonin 10 MG TABS Take by mouth.     omeprazole (PRILOSEC) 40 MG capsule Take 40 mg by mouth every morning.     ondansetron (ZOFRAN-ODT) 4 MG disintegrating tablet Take 1 tablet (4 mg total) by mouth every 8 (eight) hours as needed for nausea or vomiting. 15 tablet 0   potassium citrate (UROCIT-K) 10 MEQ (1080 MG) SR tablet Take by mouth.     traZODone (DESYREL) 50 MG tablet TAKE 1 & 1/2 TABLETS BY MOUTH 30 MINS BEFORE BEDTIME.  45 tablet 6   No facility-administered medications prior to visit.    Allergies  Allergen Reactions   Amoxicillin Rash    Review of Systems  Constitutional:  Negative for chills and fever.  HENT:  Negative for congestion, ear discharge, ear pain, facial swelling, hearing loss and tinnitus.   Eyes:  Negative for visual disturbance.  Respiratory:  Negative for chest tightness and shortness of breath.   Neurological:  Positive for dizziness and headaches.       Objective:    Physical Exam HENT:     Head: Normocephalic.     Mouth/Throat:     Mouth: Mucous membranes are moist.  Cardiovascular:     Rate and Rhythm: Normal rate.     Heart sounds: Normal heart sounds.  Pulmonary:     Effort: Pulmonary effort is normal.     Breath sounds: Normal breath sounds.  Neurological:     Mental Status: She is alert.     BP 125/84   Pulse 93   Wt 102 lb 1.9 oz (46.3 kg)   LMP 11/07/2022   SpO2 98%   BMI 20.75 kg/m  Wt Readings from Last 3 Encounters:  11/11/22 102 lb 1.9 oz (46.3 kg) (5%, Z= -1.63)*  10/16/22 102 lb 6.4 oz (46.4 kg) (5%, Z= -1.60)*  07/07/22 109 lb 6.4 oz (49.6 kg) (15%, Z= -1.04)*   * Growth percentiles are based on CDC (Girls, 2-20 Years) data.       Assessment & Plan:  Vertigo Assessment & Plan: Management of Vertigo -Start taking meclizine 12.5 mg up to three times daily as needed for symptoms of dizziness. Administer the medication approximately one hour prior to anticipated onset of symptoms. -Continue using ondansetron (Zofran) as needed for the management of nausea and vomiting. -Increase fluid intake to a minimum of 64 ounces daily to support overall hydration. -Transition between positions slowly to minimize vertigo symptoms. Avoid sudden head movements and maintain a position with your head elevated during sleep to further alleviate vertigo. -Adequate hydration is crucial for maintaining proper inner ear function, as dehydration can exacerbate  dizziness.  -Provided a sample of Nurtec 75 mcg in the clinic today -Please take Nurtec 75 mg once daily for the abortive treatment of your headache. The maximum allowable dose of Nurtec within a 24-hour period is 75 mg.   Orders: -     Meclizine HCl; Take 1 tablet (12.5 mg total) by mouth 3 (three) times daily as needed for dizziness.  Dispense: 30 tablet; Refill: 0 -     CBC with Differential/Platelet -  BMP8+EGFR  Viral illness -     COVID-19, Flu A+B and RSV   Note: This chart has been completed using Engineer, civil (consulting) software, and while attempts have been made to ensure accuracy, certain words and phrases may not be transcribed as intended.   Gilmore Laroche, FNP

## 2022-11-12 DIAGNOSIS — R42 Dizziness and giddiness: Secondary | ICD-10-CM | POA: Insufficient documentation

## 2022-11-12 LAB — BMP8+EGFR
BUN/Creatinine Ratio: 8 — ABNORMAL LOW (ref 9–23)
BUN: 7 mg/dL (ref 6–20)
CO2: 28 mmol/L (ref 20–29)
Calcium: 10.1 mg/dL (ref 8.7–10.2)
Chloride: 96 mmol/L (ref 96–106)
Creatinine, Ser: 0.87 mg/dL (ref 0.57–1.00)
Glucose: 83 mg/dL (ref 70–99)
Potassium: 4.6 mmol/L (ref 3.5–5.2)
Sodium: 138 mmol/L (ref 134–144)
eGFR: 98 mL/min/{1.73_m2} (ref >59)

## 2022-11-12 LAB — CBC WITH DIFFERENTIAL/PLATELET
Basophils Absolute: 0 10*3/uL (ref 0.0–0.2)
Basos: 1 %
EOS (ABSOLUTE): 0 10*3/uL (ref 0.0–0.4)
Eos: 0 %
Hematocrit: 45.1 % (ref 34.0–46.6)
Hemoglobin: 15.1 g/dL (ref 11.1–15.9)
Immature Grans (Abs): 0 10*3/uL (ref 0.0–0.1)
Immature Granulocytes: 0 %
Lymphocytes Absolute: 1.2 10*3/uL (ref 0.7–3.1)
Lymphs: 31 %
MCH: 32.8 pg (ref 26.6–33.0)
MCHC: 33.5 g/dL (ref 31.5–35.7)
MCV: 98 fL — ABNORMAL HIGH (ref 79–97)
Monocytes Absolute: 0.4 10*3/uL (ref 0.1–0.9)
Monocytes: 10 %
Neutrophils Absolute: 2.1 10*3/uL (ref 1.4–7.0)
Neutrophils: 58 %
Platelets: 268 10*3/uL (ref 150–450)
RBC: 4.6 x10E6/uL (ref 3.77–5.28)
RDW: 12 % (ref 11.7–15.4)
WBC: 3.7 10*3/uL (ref 3.4–10.8)

## 2022-11-12 NOTE — Assessment & Plan Note (Signed)
Management of Vertigo -Start taking meclizine 12.5 mg up to three times daily as needed for symptoms of dizziness. Administer the medication approximately one hour prior to anticipated onset of symptoms. -Continue using ondansetron (Zofran) as needed for the management of nausea and vomiting. -Increase fluid intake to a minimum of 64 ounces daily to support overall hydration. -Transition between positions slowly to minimize vertigo symptoms. Avoid sudden head movements and maintain a position with your head elevated during sleep to further alleviate vertigo. -Adequate hydration is crucial for maintaining proper inner ear function, as dehydration can exacerbate dizziness.  -Provided a sample of Nurtec 75 mcg in the clinic today -Please take Nurtec 75 mg once daily for the abortive treatment of your headache. The maximum allowable dose of Nurtec within a 24-hour period is 75 mg.

## 2022-11-12 NOTE — ED Provider Notes (Signed)
[facility]   [id number] 11/08/22 Arrival Time: 1021  ASSESSMENT & PLAN:  1. Vertigo   2. Acute intractable headache, unspecified headache type   3. Dehydration   4. Nausea and vomiting, unspecified vomiting type     Meds ordered this encounter  Medications   DISCONTD: sodium chloride 0.9 % bolus 1,000 mL   ondansetron (ZOFRAN-ODT) disintegrating tablet 4 mg   ondansetron (ZOFRAN-ODT) 4 MG disintegrating tablet    Sig: Take 1 tablet (4 mg total) by mouth every 8 (eight) hours as needed for nausea or vomiting.    Dispense:  15 tablet    Refill:  0   Unsuccessful in getting IV x 2. She will hydrate as well as possible and return if needed. ED if wornsening. Zofran as needed. Suspect viral trigger. Vertigo is now much better. Normal ambulation. Normal neurological exam.  Results for orders placed or performed during the hospital encounter of 11/08/22  POCT CBG (manual entry)  Result Value Ref Range   POCT Glucose (KUC) 64 (A) 70 - 99 mg/dL  POCT urinalysis dipstick  Result Value Ref Range   Color, UA red (A) yellow   Clarity, UA cloudy (A) clear   Glucose, UA negative negative mg/dL   Bilirubin, UA moderate (A) negative   Ketones, POC UA >= (160) (A) negative mg/dL   Spec Grav, UA 4.696 2.952 - 1.025   Blood, UA large (A) negative   pH, UA 5.5 5.0 - 8.0   Protein Ur, POC =30 (A) negative mg/dL   Urobilinogen, UA 0.2 0.2 or 1.0 E.U./dL   Nitrite, UA Negative Negative   Leukocytes, UA Trace (A) Negative  POCT urine pregnancy  Result Value Ref Range   Preg Test, Ur Negative Negative     Follow-up Information      Emergency Department at [facility].   Specialty: Emergency Medicine Why: If worsening or failing to improve as anticipated. Contact information: [address] [place] [address] [phone]                Reviewed expectations re: course of current medical issues. Questions answered. Outlined  signs and symptoms indicating need for more acute intervention. Patient verbalized understanding. After Visit Summary given.   SUBJECTIVE: History from: patient and family. Bonnie Simon is a 22 y.o. female who reports she has been dizzy, headache, N/V, and loss of appetite x 3 days. No emesis today but is nauseous. Denies abd pain. Denies fever. Has had some vertigo when turning head. This is better. Mild ear pressure. No extremity sensation changes or weakness.      OBJECTIVE:  Vitals:   11/08/22 1415  BP: 115/77  Pulse: 95  Resp: 20  Temp: (!) 97.2 F (36.2 C)  TempSrc: Oral  SpO2: 97%    General appearance: alert; no distress Eyes: PERRLA; EOMI; conjunctiva normal HENT: normocephalic; atraumatic; mild serous otitis Neck: supple  Lungs: clear to auscultation bilaterally; unlabored Heart: regular rate and rhythm without murmer Abdomen: soft, non-tender; bowel sounds normal; no masses or organomegaly; no guarding or rebound tenderness Back: no CVA tenderness Extremities: no edema; symmetrical with no gross deformities Skin: warm and dry Neurologic: normal gait; normal symmetric reflexes Psychological: alert and cooperative; normal mood and affect  Labs: Results for orders placed or performed during the hospital encounter of 11/08/22  POCT CBG (manual entry)  Result Value Ref Range   POCT Glucose (KUC) 64 (A) 70 - 99 mg/dL  POCT urinalysis dipstick  Result Value Ref Range   Color, UA red (A) yellow   Clarity, UA cloudy (A) clear   Glucose, UA negative negative mg/dL   Bilirubin, UA moderate (A) negative   Ketones, POC UA >= (160) (A) negative mg/dL   Spec Grav, UA 5.284 1.324 - 1.025   Blood, UA large (A) negative   pH, UA 5.5 5.0 - 8.0   Protein Ur, POC =30 (A) negative mg/dL   Urobilinogen, UA 0.2 0.2 or 1.0 E.U./dL   Nitrite, UA Negative Negative   Leukocytes, UA Trace (A) Negative  POCT urine pregnancy  Result Value Ref Range   Preg Test, Ur  Negative Negative   Labs Reviewed  POCT FASTING CBG KUC MANUAL ENTRY - Abnormal; Notable for the following components:      Result Value   POCT Glucose (KUC) 64 (*)    All other components within normal limits  POCT URINALYSIS DIP (MANUAL ENTRY) - Abnormal; Notable for the following components:   Color, UA red (*)    Clarity, UA cloudy (*)    Bilirubin, UA moderate (*)    Ketones, POC UA >= (160) (*)    Blood, UA large (*)    Protein Ur, POC =30 (*)    Leukocytes, UA Trace (*)    All other components within normal limits  POCT URINE PREGNANCY    Imaging: No results found.  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   She attends [employer].    Katona enjoys playing marching band, basketball, soccer, and watching TV.   Bryssa lives with her parents. She has an adult sister that does not live at home.   Social Determinants of Health   Financial Resource Strain: Not on file  Food Insecurity: Not on file  Transportation Needs: Not on file  Physical Activity: Not on file  Stress: Not on file  Social Connections: Unknown (02/18/2022)   [facility]   Social Network    Social Network: Not on file  Intimate Partner Violence: Unknown (02/18/2022)   Received from [facility], [facility]   HITS    Physically Hurt: Not on file    Insult or Talk Down To: Not on file    Threaten Physical Harm: Not on file    Scream or Curse: Not on file   Family History  Problem Relation Age of Onset   Heart Problems Paternal Grandfather        Died at 34   Past Surgical  History:  Procedure Laterality Date   BOTOX INJECTION     Dr. Kennon Portela at [facility], Arlys John, MD 11/12/22 [phone]

## 2022-11-13 ENCOUNTER — Other Ambulatory Visit (INDEPENDENT_AMBULATORY_CARE_PROVIDER_SITE_OTHER): Payer: Self-pay

## 2022-11-13 DIAGNOSIS — F409 Phobic anxiety disorder, unspecified: Secondary | ICD-10-CM

## 2022-11-13 LAB — COVID-19, FLU A+B AND RSV
Influenza A, NAA: NOT DETECTED
Influenza B, NAA: NOT DETECTED
RSV, NAA: NOT DETECTED
SARS-CoV-2, NAA: NOT DETECTED

## 2022-11-13 MED ORDER — CLONIDINE HCL 0.1 MG PO TABS: ORAL_TABLET | ORAL | 5 refills | Status: DC

## 2022-11-16 ENCOUNTER — Telehealth (INDEPENDENT_AMBULATORY_CARE_PROVIDER_SITE_OTHER): Payer: Self-pay | Admitting: Family

## 2022-11-16 DIAGNOSIS — S0990XA Unspecified injury of head, initial encounter: Secondary | ICD-10-CM | POA: Insufficient documentation

## 2022-11-16 NOTE — Patient Instructions (Incomplete)
It was a pleasure to see you today!  You have a concussion, also known as a closed head injury. This takes time to resolve and the length of time for each person varies.  1. To help your concussion to heal, it is important that you rest each day. You should have a schedule in which you get up, eat breakfast, so schoolwork or do chores, then rest for awhile. Repeat this pattern during the day so that you do activities and rest. You should go to bed on time and get up on time each day.  2. It is important to eat 3 meals each day. If you are not very hungry, they can be small meals but try to not to skip a meal altogether. Your brain needs to nutrition to help it heal 3. It is important to drink plenty of water each day. For your size, you should be drinking 48-60oz of water or sports drinks per day as a minimum.  4. You should not run, play sports or exercise. You can walk and do quiet activities.  5. If you are looking at a computer, tablet or TV and your head hurts more, stop and rest. A good way to do it is to look at a screen for no more than 15 minutes at a time without resting.  6. For school -  7. Please plan to return for follow up  Feel free to contact our office during normal business hours at [phone] with questions or concerns. If there is no answer or the call is outside business hours, please leave a message and our clinic staff will call you back within the next business day.  If you have an urgent concern, please stay on the line for our after-hours answering service and ask for the on-call neurologist.     I also encourage you to use MyChart to communicate with me more directly. If you have not yet signed up for MyChart within [facility], the front desk staff can help you. However, please note that this inbox is NOT monitored on nights or weekends, and response can take up to 2 business days.  Urgent matters should be discussed with the on-call pediatric neurologist.   At [facility], we are committed to providing exceptional care. You will receive a patient satisfaction survey through text or email regarding your visit today. Your opinion is important to me. Comments are appreciated.

## 2022-11-16 NOTE — Telephone Encounter (Signed)
I received a call from [facility] to speak to patient's mother. She said that Bonnie Simon has been sick for 2 weeks with vertigo, nausea and vomiting. She was seen at Urgent Care last week for symptoms and was told that she was dehydrated. Mom learned today that the day before the symptoms began 2 weeks ago that [name] had spent time with a friend and that in the process of doing a TikTok video that she fell and struck her head. I told Mom that [name] likely has a concussion, based on her symptoms and description of the event. I recommended rest and increased fluid intake to 60oz per day. I scheduled an appointment for evaluation with me tomorrow at 4:00PM. Mom agreed with these plans. TG

## 2022-11-16 NOTE — Progress Notes (Unsigned)
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 [name] and has remained seizure free since July 2013. She has a Information systems manager. [name] for anxiety, as well as Clonidine and Trazodone for insomnia.   Today's concerns: She  Yamaira has been otherwise generally healthy since she was last seen. No health concerns today other than previously mentioned.  Review of systems: Please see HPI for neurologic and other pertinent review of systems. Otherwise all other systems were reviewed and were negative.  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 Kennon Portela at [facility]     Family history: family history includes Heart Problems in her paternal grandfather.   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   She attends [employer].    Zayanna enjoys playing marching band, basketball, soccer, and watching TV.   Boyce lives with her parents. She has an adult sister that does not live at home.   Social Determinants of Health   Financial Resource Strain: Not on file  Food Insecurity: Not on file  Transportation Needs: Not on file  Physical Activity: Not on file  Stress: Not on file  Social Connections: Unknown (02/18/2022)   [facility]   Social Network    Social Network: Not on file  Intimate Partner Violence: Unknown (02/18/2022)   [facility], [facility]   HITS    Physically Hurt: Not on file    Insult or Talk Down To: Not on file    Threaten Physical Harm: Not on file    Scream or Curse: Not on file    Past/failed meds:  Allergies: Allergies  Allergen Reactions   Amoxicillin Rash    Immunizations: Immunization History   Administered Date(s) Administered   PFIZER(Purple Top)SARS-COV-2 Vaccination 10/16/2019    Diagnostics/Screenings: Copied from previous record: 09/13/2013 - rEEG - This is a abnormal record with the patient awake.  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  [name], M.D.   05/19/2007 - rEEG - Abnormal EEG on the basis of slowing over the right hemisphere, which is indicative of underlying structural and/or vascular  abnormality and would correlate with the patient's left hemiparesis. This may also be a result of a postictal change from a right brain  signature seizure. [name], M.D.   07/22/2006 - CT Head wo contrast - Patient had a remote periventricular hemorrhage with subsequent dilatation of the right lateral ventricle appearring similar to prior MR. The present exam is motion degraded without obvious intracranial hemorrhage or new mass identified.  Followup MR without motion may be considered for further delineation for investigation of seizure focus when the patient is able to cooperate or be properly sedated.    01/2017 - diagnosis of autism spectrum disorder by Dr Reggy Eye.    Physical Exam: LMP 11/07/2022   General: well developed, well nourished, seated, in no evident distress Head: normocephalic and atraumatic. No dysmorphic features. Neck: supple Musculoskeletal: No skeletal deformities or obvious scoliosis Skin: no rashes or neurocutaneous lesions  Neurologic Exam Mental Status: Awake and fully alert.  Attention span, concentration, and fund of knowledge appropriate for age.  Speech fluent without dysarthria.  Able to follow commands and participate in examination. Cranial Nerves: Turns to localize faces, objects and sounds in the periphery. Facial sensation intact.  Face, tongue, palate move normally and symmetrically. Motor: Normal functional bulk, tone and  strength Sensory: Intact to touch and temperature in all extremities. Coordination: Finger-to-nose and heel-to-shin intact bilaterally. Balance adequate Gait and Station: Arises from chair, without difficulty. Stance is normal.  Gait demonstrates normal stride length and balance.   Impression: Closed head injury, initial encounter  Congenital hemiplegia (HCC)  Generalized convulsive epilepsy (HCC)  Partial epilepsy with impairment of consciousness (HCC)  Episodic tension-type headache, not intractable  Intraventricular hemorrhage of newborn, grade IV  Generalized anxiety disorder  Autism spectrum disorder  Nephrolithiasis  Insomnia due to anxiety and fear  Vertigo   Recommendations for plan of care: The patient's previous Epic records were reviewed. No recent diagnostic studies to be reviewed with the patient.  Plan until next visit: Continue medications as prescribed  Call for questions or concerns No follow-ups on file.  The medication list was reviewed and reconciled. No changes were made in the prescribed medications today. A complete medication list was provided to the patient.  No orders of the defined types were placed in this encounter.    Allergies as of 11/17/2022       Reactions   Amoxicillin Rash        Medication List        Accurate as of November 16, 2022  4:35 PM. If you have any questions, ask your nurse or doctor.  carbamazepine 100 MG chewable tablet Commonly known as: TEGRETOL CHEW 2 TABLETS BY MOUTH IN THE MORNING, 2 TABLETS IN THE AFTERNOON, AND 3 TABLETS AT BEDTIME.   cloNIDine 0.1 MG tablet Commonly known as: CATAPRES TAKE 1 AND 1/2 TABLETS BY MOUTH 30 MINUTES BEFORE BEDTIME.   FLUoxetine 20 MG/5ML solution Commonly known as: PROZAC TAKE 4 MLS BY MOUTH ONCE DAILY.   hydrochlorothiazide 12.5 MG tablet Commonly known as: HYDRODIURIL Take 1 tablet by mouth daily.   ibuprofen 200 MG tablet Commonly known as:  ADVIL Take 1 tablet at onset of headache, may repeat in 6 hours if needed.   meclizine 12.5 MG tablet Commonly known as: ANTIVERT Take 1 tablet (12.5 mg total) by mouth 3 (three) times daily as needed for dizziness.   Melatonin 10 MG Tabs Take by mouth.   Melatonin 10 MG Tabs Take 1 tablet at bedtime   omeprazole 40 MG capsule Commonly known as: PRILOSEC Take 40 mg by mouth every morning.   ondansetron 4 MG disintegrating tablet Commonly known as: ZOFRAN-ODT Take 1 tablet (4 mg total) by mouth every 8 (eight) hours as needed for nausea or vomiting.   potassium citrate 10 MEQ (1080 MG) SR tablet Commonly known as: UROCIT-K Take by mouth.   traZODone 50 MG tablet Commonly known as: DESYREL TAKE 1 & 1/2 TABLETS BY MOUTH 30 MINS BEFORE BEDTIME.      Total time spent with the patient was *** minutes, of which 50% or more was spent in counseling and coordination of care.  Elveria Rising NP-C [facility] [address] [place], [place] [address] Ph. [phone] Fax [phone]

## 2022-11-17 ENCOUNTER — Encounter (INDEPENDENT_AMBULATORY_CARE_PROVIDER_SITE_OTHER): Payer: Self-pay | Admitting: Family

## 2022-11-17 ENCOUNTER — Ambulatory Visit (INDEPENDENT_AMBULATORY_CARE_PROVIDER_SITE_OTHER): Payer: BC Managed Care – PPO | Admitting: Family

## 2022-11-17 VITALS — BP 120/76 | HR 76 | Ht 58.47 in | Wt 98.8 lb

## 2022-11-17 DIAGNOSIS — K219 Gastro-esophageal reflux disease without esophagitis: Secondary | ICD-10-CM

## 2022-11-17 DIAGNOSIS — G40209 Localization-related (focal) (partial) symptomatic epilepsy and epileptic syndromes with complex partial seizures, not intractable, without status epilepticus: Secondary | ICD-10-CM

## 2022-11-17 DIAGNOSIS — G40309 Generalized idiopathic epilepsy and epileptic syndromes, not intractable, without status epilepticus: Secondary | ICD-10-CM

## 2022-11-17 DIAGNOSIS — S0990XA Unspecified injury of head, initial encounter: Secondary | ICD-10-CM

## 2022-11-17 DIAGNOSIS — F840 Autistic disorder: Secondary | ICD-10-CM

## 2022-11-17 DIAGNOSIS — N200 Calculus of kidney: Secondary | ICD-10-CM

## 2022-11-17 DIAGNOSIS — F411 Generalized anxiety disorder: Secondary | ICD-10-CM

## 2022-11-17 DIAGNOSIS — G808 Other cerebral palsy: Secondary | ICD-10-CM | POA: Diagnosis not present

## 2022-11-17 DIAGNOSIS — R42 Dizziness and giddiness: Secondary | ICD-10-CM | POA: Diagnosis not present

## 2022-11-17 DIAGNOSIS — G44309 Post-traumatic headache, unspecified, not intractable: Secondary | ICD-10-CM | POA: Insufficient documentation

## 2022-11-17 DIAGNOSIS — F409 Phobic anxiety disorder, unspecified: Secondary | ICD-10-CM

## 2022-11-17 DIAGNOSIS — G44219 Episodic tension-type headache, not intractable: Secondary | ICD-10-CM

## 2022-11-17 DIAGNOSIS — F5105 Insomnia due to other mental disorder: Secondary | ICD-10-CM

## 2022-11-17 MED ORDER — FAMOTIDINE 10 MG PO TABS: 10.0000 mg | ORAL_TABLET | Freq: Two times a day (BID) | ORAL | 2 refills | Status: DC

## 2022-11-21 ENCOUNTER — Telehealth (INDEPENDENT_AMBULATORY_CARE_PROVIDER_SITE_OTHER): Payer: Self-pay | Admitting: Family

## 2022-11-21 NOTE — Telephone Encounter (Signed)
Contacted patients mother. Verified patients name and DOB as well as mothers name.  I relayed the previous message from provider. Mother stated that this was an inconvenience to her as she is in [place] today.   I asked mom if Romya has access to her MyChart? Mom stated that she does, I offered to send the documentation via MyChart mom accepted.   Mom asked that the documentation be sent to her email as well as MyChart message.  [name]  I verbalized understanding.  SS, CCMA

## 2022-11-21 NOTE — Telephone Encounter (Signed)
Please let Mom know that I am not in the office this afternoon but will have the documentation she needs Monday morning. Thanks, Inetta Fermo

## 2022-11-21 NOTE — Telephone Encounter (Signed)
  Name of who is calling: Riley Kill Relationship to Patient: mom   Best contact number: [phone]  Provider they see: Inetta Fermo   Reason for call: Mom called wondering if she can get copies of documentation stating that Brielle has a concussion. She says her and Inetta Fermo discussed something like this and she would like to know if she can pick that up soon due to patient being in school.      PRESCRIPTION REFILL ONLY  Name of prescription:  Pharmacy:

## 2022-11-24 ENCOUNTER — Encounter (INDEPENDENT_AMBULATORY_CARE_PROVIDER_SITE_OTHER): Payer: Self-pay

## 2022-11-24 NOTE — Telephone Encounter (Signed)
The letter was written and sent via MyChart and email as requested. TG

## 2022-12-01 ENCOUNTER — Emergency Department (HOSPITAL_COMMUNITY): Payer: BC Managed Care – PPO

## 2022-12-01 ENCOUNTER — Emergency Department (HOSPITAL_COMMUNITY)
Admission: EM | Admit: 2022-12-01 | Discharge: 2022-12-01 | Disposition: A | Discharge status: Home / Self Care | Payer: BC Managed Care – PPO

## 2022-12-01 ENCOUNTER — Other Ambulatory Visit: Payer: Self-pay

## 2022-12-01 ENCOUNTER — Encounter (HOSPITAL_COMMUNITY): Payer: Self-pay

## 2022-12-01 ENCOUNTER — Telehealth (INDEPENDENT_AMBULATORY_CARE_PROVIDER_SITE_OTHER): Payer: Self-pay | Admitting: Family

## 2022-12-01 DIAGNOSIS — R42 Dizziness and giddiness: Secondary | ICD-10-CM | POA: Diagnosis not present

## 2022-12-01 DIAGNOSIS — M542 Cervicalgia: Secondary | ICD-10-CM | POA: Diagnosis not present

## 2022-12-01 DIAGNOSIS — S0990XA Unspecified injury of head, initial encounter: Secondary | ICD-10-CM | POA: Diagnosis not present

## 2022-12-01 DIAGNOSIS — G9389 Other specified disorders of brain: Secondary | ICD-10-CM | POA: Diagnosis not present

## 2022-12-01 DIAGNOSIS — K5730 Diverticulosis of large intestine without perforation or abscess without bleeding: Secondary | ICD-10-CM | POA: Diagnosis not present

## 2022-12-01 DIAGNOSIS — S199XXA Unspecified injury of neck, initial encounter: Secondary | ICD-10-CM | POA: Diagnosis not present

## 2022-12-01 DIAGNOSIS — R109 Unspecified abdominal pain: Secondary | ICD-10-CM | POA: Diagnosis not present

## 2022-12-01 DIAGNOSIS — N200 Calculus of kidney: Secondary | ICD-10-CM | POA: Diagnosis not present

## 2022-12-01 HISTORY — DX: Cerebral palsy, unspecified: G80.9

## 2022-12-01 LAB — URINALYSIS, ROUTINE W REFLEX MICROSCOPIC
Bilirubin Urine: NEGATIVE
Glucose, UA: NEGATIVE mg/dL
Hgb urine dipstick: NEGATIVE
Ketones, ur: 5 mg/dL — AB
Leukocytes,Ua: NEGATIVE
Nitrite: NEGATIVE
Protein, ur: NEGATIVE mg/dL
Specific Gravity, Urine: 1.006 (ref 1.005–1.030)
pH: 7 (ref 5.0–8.0)

## 2022-12-01 LAB — BASIC METABOLIC PANEL
Anion gap: 9 (ref 5–15)
BUN: 9 mg/dL (ref 6–20)
CO2: 27 mmol/L (ref 22–32)
Calcium: 8.9 mg/dL (ref 8.9–10.3)
Chloride: 102 mmol/L (ref 98–111)
Creatinine, Ser: 0.61 mg/dL (ref 0.44–1.00)
GFR, Estimated: >60 mL/min (ref >60)
Glucose, Bld: 89 mg/dL (ref 70–99)
Potassium: 3.2 mmol/L — ABNORMAL LOW (ref 3.5–5.1)
Sodium: 138 mmol/L (ref 135–145)

## 2022-12-01 LAB — CBC
HCT: 43.9 % (ref 36.0–46.0)
Hemoglobin: 15.3 g/dL — ABNORMAL HIGH (ref 12.0–15.0)
MCH: 33 pg (ref 26.0–34.0)
MCHC: 34.9 g/dL (ref 30.0–36.0)
MCV: 94.6 fL (ref 80.0–100.0)
Platelets: 242 10*3/uL (ref 150–400)
RBC: 4.64 MIL/uL (ref 3.87–5.11)
RDW: 11.2 % — ABNORMAL LOW (ref 11.5–15.5)
WBC: 5.4 10*3/uL (ref 4.0–10.5)
nRBC: 0 % (ref 0.0–0.2)

## 2022-12-01 LAB — HCG, SERUM, QUALITATIVE: Preg, Serum: NEGATIVE

## 2022-12-01 MED ORDER — PROMETHAZINE HCL 25 MG PO TABS: 25.0000 mg | ORAL_TABLET | Freq: Four times a day (QID) | ORAL | 0 refills | Status: DC | PRN

## 2022-12-01 MED ORDER — KETOROLAC TROMETHAMINE 10 MG PO TABS: 10.0000 mg | ORAL_TABLET | Freq: Four times a day (QID) | ORAL | 0 refills | Status: DC | PRN

## 2022-12-01 MED ORDER — SODIUM CHLORIDE 0.9 % IV SOLN
12.5000 mg | Freq: Four times a day (QID) | INTRAVENOUS | Status: DC | PRN
  Administered 2022-12-01: 12.5 mg via INTRAVENOUS
  Filled 2022-12-01: qty 12.5
  Filled 2022-12-01: qty 0.5

## 2022-12-01 MED ORDER — SODIUM CHLORIDE 0.9 % IV BOLUS
1000.0000 mL | Freq: Once | INTRAVENOUS | Status: AC
  Administered 2022-12-01: 1000 mL via INTRAVENOUS

## 2022-12-01 NOTE — Telephone Encounter (Signed)
I called and talked to Mom. She said that Magdelene is very dizzy, can't tolerate anything to eat or drink other than soup and gatorade, then she vomits. She has been taking Meclizine but says that it worsens the dizziness. She said that there is a "bump" on the back of her neck that is sticking out more than usual and Mom worries that something is wrong in that area. Mom also worries that the concussion is worse than initially thought and wants testing done. I explained that Heleena's dizziness is likely from not eating and drinking enough, as well as the vomiting. I explained that I can order a scan but that we have to go through the process of getting insurance approval then scheduling the test, and that I have no way of knowing how long that will take. I recommended contacting her GI doc about the vomiting and Mom said that she has been discharged from them. I told Mom that the other option is to take [facility] to ED for evaluation. Mom decided to talk with Dad and will let me know. TG

## 2022-12-01 NOTE — Telephone Encounter (Signed)
Mom called back and has decided to take [facility] to the ED. TG

## 2022-12-01 NOTE — ED Provider Notes (Signed)
[place] EMERGENCY DEPARTMENT AT [facility] Provider Note   CSN: [id number] Arrival date & time: 12/01/22  1554     History  Chief Complaint  Patient presents with   Dizziness    Bonnie Simon is a 22 y.o. female.  22 year old female with past medical history of kidney stones in the past presenting to the emergency department today with persistent dizziness, nausea, and vomiting.  The patient was diagnosed with a concussion after she fell and hit her head 4 weeks ago.  She has followed up with her neurologist and was diagnosed with a concussion.  The patient states that she continues to be dizzy despite being on meclizine as well as Zofran.  She has been having multiple episodes of nonbloody, nonbilious emesis with that.  She states that she is also having pain in her neck that radiates down her left arm since she fell.  She also is reporting some flank pain.  She was scheduled to have lithotripsy performed but given her recent concussion diagnosis her surgery was canceled.  She states that she has bilateral flank pain that comes and goes.  She denies any urinary symptoms.  She called her neurologist today and was reporting that her symptoms were getting worse so she came to the ER today for further evaluation regarding this.   Dizziness Associated symptoms: nausea and vomiting        Home Medications Prior to Admission medications   Medication Sig Start Date End Date Taking? Authorizing Provider  ketorolac (TORADOL) 10 MG tablet Take 1 tablet (10 mg total) by mouth every 6 (six) hours as needed. 12/01/22  Yes Durwin Glaze, MD  promethazine (PHENERGAN) 25 MG tablet Take 1 tablet (25 mg total) by mouth every 6 (six) hours as needed for nausea or vomiting. 12/01/22  Yes Durwin Glaze, MD  carbamazepine (TEGRETOL) 100 MG chewable tablet CHEW 2 TABLETS BY MOUTH IN THE MORNING, 2 TABLETS IN THE AFTERNOON, AND 3 TABLETS AT BEDTIME. 10/06/22   Elveria Rising, NP   cloNIDine (CATAPRES) 0.1 MG tablet TAKE 1 AND 1/2 TABLETS BY MOUTH 30 MINUTES BEFORE BEDTIME. 11/13/22   Elveria Rising, NP  famotidine (PEPCID) 10 MG tablet Take 1 tablet (10 mg total) by mouth 2 (two) times daily. 11/17/22 02/15/23  Elveria Rising, NP  FLUoxetine (PROZAC) 20 MG/5ML solution TAKE 4 MLS BY MOUTH ONCE DAILY. Patient not taking: Reported on 11/17/2022 10/29/22   Elveria Rising, NP  hydrochlorothiazide (HYDRODIURIL) 12.5 MG tablet Take 1 tablet by mouth daily. Patient not taking: Reported on 11/17/2022 09/30/22 09/30/23  [provider]  ibuprofen (ADVIL) 200 MG tablet Take 1 tablet at onset of headache, may repeat in 6 hours if needed. Patient not taking: Reported on 11/17/2022 07/02/15   Elveria Rising, NP  meclizine (ANTIVERT) 12.5 MG tablet Take 1 tablet (12.5 mg total) by mouth 3 (three) times daily as needed for dizziness. 11/11/22   Gilmore Laroche, FNP  Melatonin 10 MG TABS Take 1 tablet at bedtime 03/11/19   Elveria Rising, NP  Melatonin 10 MG TABS Take by mouth. Patient not taking: Reported on 11/17/2022    [provider]  omeprazole (PRILOSEC) 40 MG capsule Take 40 mg by mouth every morning. Patient not taking: Reported on 11/17/2022    [provider]  ondansetron (ZOFRAN-ODT) 4 MG disintegrating tablet Take 1 tablet (4 mg total) by mouth every 8 (eight) hours as needed for nausea or vomiting. 11/08/22   Mardella Layman, MD  potassium  citrate (UROCIT-K) 10 MEQ (1080 MG) SR tablet Take by mouth. Patient not taking: Reported on 11/17/2022 09/30/22 09/30/23  [provider]  traZODone (DESYREL) 50 MG tablet TAKE 1 & 1/2 TABLETS BY MOUTH 30 MINS BEFORE BEDTIME. 10/21/22   Elveria Rising, NP      Allergies    Amoxicillin    Review of Systems   Review of Systems  Gastrointestinal:  Positive for nausea and vomiting.  Neurological:  Positive for dizziness.  All other systems reviewed and are negative.   Physical Exam Updated Vital  Signs BP 131/84   Pulse 80   Temp (!) 97 F (36.1 C)   Resp 18   Ht 4\' 10"  (1.473 m)   Wt 44.5 kg   LMP 11/07/2022   SpO2 100%   BMI 20.48 kg/m  Physical Exam Vitals and nursing note reviewed.   Gen: NAD Eyes: PERRL, EOMI HEENT: no oropharyngeal swelling Neck: trachea midline, patient is tender over the mid cervical spine as well as the left paraspinal region Resp: clear to auscultation bilaterally Card: RRR, no murmurs, rubs, or gallops Abd: nontender, nondistended Extremities: no calf tenderness, no edema Vascular: 2+ radial pulses bilaterally, 2+ DP pulses bilaterally Neuro: Equal strength sensation throughout bilateral upper and lower, no dysmetria on finger-to-nose testing extremities, cranial nerves intact Skin: no rashes Psyc: acting appropriately   ED Results / Procedures / Treatments   Labs (all labs ordered are listed, but only abnormal results are displayed) Labs Reviewed  CBC - Abnormal; Notable for the following components:      Result Value   Hemoglobin 15.3 (*)    RDW 11.2 (*)    All other components within normal limits  URINALYSIS, ROUTINE W REFLEX MICROSCOPIC - Abnormal; Notable for the following components:   Color, Urine STRAW (*)    Ketones, ur 5 (*)    All other components within normal limits  BASIC METABOLIC PANEL - Abnormal; Notable for the following components:   Potassium 3.2 (*)    All other components within normal limits  HCG, SERUM, QUALITATIVE    EKG None  Radiology CT ABDOMEN PELVIS WO CONTRAST  Result Date: 12/01/2022 CLINICAL DATA:  Abdominal/flank pain, stone suspected EXAM: CT ABDOMEN AND PELVIS WITHOUT CONTRAST TECHNIQUE: Multidetector CT imaging of the abdomen and pelvis was performed following the standard protocol without IV contrast. RADIATION DOSE REDUCTION: This exam was performed according to the departmental dose-optimization program which includes automated exposure control, adjustment of the mA and/or kV according  to patient size and/or use of iterative reconstruction technique. COMPARISON:  None Available. FINDINGS: Lower chest: No acute abnormality. Hepatobiliary: No focal liver abnormality. No gallstones, gallbladder wall thickening, or pericholecystic fluid. No biliary dilatation. Pancreas: No focal lesion. Normal pancreatic contour. No surrounding inflammatory changes. No main pancreatic ductal dilatation. Spleen: Normal in size without focal abnormality. Adrenals/Urinary Tract: No adrenal nodule bilaterally. Bilateral nephrolithiasis measuring up to 4 mm on the right and 5 mm on the left. No ureterolithiasis bilaterally. No hydroureteronephrosis bilaterally. The urinary bladder is unremarkable. Stomach/Bowel: Stomach is within normal limits. No evidence of bowel wall thickening or dilatation. Colonic diverticulosis. Appendix appears normal. Vascular/Lymphatic: No abdominal aorta or iliac aneurysm. No abdominal, pelvic, or inguinal lymphadenopathy. Reproductive: Uterus and bilateral adnexa are unremarkable. Other: No intraperitoneal free fluid. No intraperitoneal free gas. No organized fluid collection. Musculoskeletal: No abdominal wall hernia or abnormality. No suspicious lytic or blastic osseous lesions. No acute displaced fracture. IMPRESSION: 1. Nonobstructive bilateral nephrolithiasis measuring up to 5 mm.  2. Colonic diverticulosis with no acute diverticulitis. 3. Otherwise limited evaluation on this noncontrast study. Electronically Signed   By: Tish Frederickson M.D.   On: 12/01/2022 20:12   CT Head Wo Contrast  Result Date: 12/01/2022 CLINICAL DATA:  Head trauma, abnormal mental status (Age 62-64y); Neck trauma, dangerous injury mechanism (Age [phone]) Patient was treated for vertigo 2 weeks ago after falling on her head. Feels like she is not improving. Was diagnosed with a concussion. Has a knot in her neck. Unable to keep food down. Feels dehydrated. EXAM: CT HEAD WITHOUT CONTRAST CT CERVICAL SPINE WITHOUT  CONTRAST TECHNIQUE: Multidetector CT imaging of the head and cervical spine was performed following the standard protocol without intravenous contrast. Multiplanar CT image reconstructions of the cervical spine were also generated. RADIATION DOSE REDUCTION: This exam was performed according to the departmental dose-optimization program which includes automated exposure control, adjustment of the mA and/or kV according to patient size and/or use of iterative reconstruction technique. COMPARISON:  CT head 07/22/2006, MRI head 02/07/2004 FINDINGS: CT HEAD FINDINGS Brain: No evidence of large-territorial acute infarction. No parenchymal hemorrhage. No mass lesion. No extra-axial collection. No mass effect or midline shift. Stable asymmetrically enlarged right lateral ventricle compared to the left-chronic. No hydrocephalus. Basilar cisterns are patent. Vascular: No hyperdense vessel. Skull: No acute fracture or focal lesion. Sinuses/Orbits: Paranasal sinuses and mastoid air cells are clear. The orbits are unremarkable. Other: None. CT CERVICAL SPINE FINDINGS Alignment: Normal. Skull base and vertebrae: No acute fracture. No aggressive appearing focal osseous lesion or focal pathologic process. Soft tissues and spinal canal: No prevertebral fluid or swelling. No visible canal hematoma. Upper chest: Unremarkable. Other: None. IMPRESSION: 1. No acute intracranial abnormality. 2. No acute displaced fracture or traumatic listhesis of the cervical spine. Electronically Signed   By: Tish Frederickson M.D.   On: 12/01/2022 20:06   CT Cervical Spine Wo Contrast  Result Date: 12/01/2022 CLINICAL DATA:  Head trauma, abnormal mental status (Age 62-64y); Neck trauma, dangerous injury mechanism (Age [age]) Patient was treated for vertigo 2 weeks ago after falling on her head. Feels like she is not improving. Was diagnosed with a concussion. Has a knot in her neck. Unable to keep food down. Feels dehydrated. EXAM: CT HEAD WITHOUT  CONTRAST CT CERVICAL SPINE WITHOUT CONTRAST TECHNIQUE: Multidetector CT imaging of the head and cervical spine was performed following the standard protocol without intravenous contrast. Multiplanar CT image reconstructions of the cervical spine were also generated. RADIATION DOSE REDUCTION: This exam was performed according to the departmental dose-optimization program which includes automated exposure control, adjustment of the mA and/or kV according to patient size and/or use of iterative reconstruction technique. COMPARISON:  CT head 07/22/2006, MRI head 02/07/2004 FINDINGS: CT HEAD FINDINGS Brain: No evidence of large-territorial acute infarction. No parenchymal hemorrhage. No mass lesion. No extra-axial collection. No mass effect or midline shift. Stable asymmetrically enlarged right lateral ventricle compared to the left-chronic. No hydrocephalus. Basilar cisterns are patent. Vascular: No hyperdense vessel. Skull: No acute fracture or focal lesion. Sinuses/Orbits: Paranasal sinuses and mastoid air cells are clear. The orbits are unremarkable. Other: None. CT CERVICAL SPINE FINDINGS Alignment: Normal. Skull base and vertebrae: No acute fracture. No aggressive appearing focal osseous lesion or focal pathologic process. Soft tissues and spinal canal: No prevertebral fluid or swelling. No visible canal hematoma. Upper chest: Unremarkable. Other: None. IMPRESSION: 1. No acute intracranial abnormality. 2. No acute displaced fracture or traumatic listhesis of the cervical spine. Electronically Signed   By: Blanchie Serve  Tessie Fass M.D.   On: 12/01/2022 20:06    Procedures Procedures    Medications Ordered in ED Medications  promethazine (PHENERGAN) 12.5 mg in sodium chloride 0.9 % 50 mL IVPB (0 mg Intravenous Stopped 12/01/22 1843)  sodium chloride 0.9 % bolus 1,000 mL (0 mLs Intravenous Stopped 12/01/22 2124)    ED Course/ Medical Decision Making/ A&P                                 Medical Decision  Making 22 year old female with past medical history of kidney stones in the past presenting to the emergency department today with dizziness, nausea, and vomiting as well as flank pain over the past 4 weeks.  I will further evaluate her here with basic labs to eval for electrolyte abnormalities.  Also obtain an hCG.  I will give patient IV fluids as well as Phenergan for symptoms.  Will obtain a CT scan of her head and cervical spine for further evaluation for acute traumatic injuries from her fall as well as any intracranial hemorrhage or mass lesion.  Will also obtain a CT of her abdomen without contrast if her pregnancy test is negative to further evaluate for ureteral stones.  I will reevaluate for ultimate disposition.  All he symptoms may be due to concussion but this would be a diagnosis of exclusion.  The patient's labs are reassuring.  CT scans are unremarkable.  She is feeling better after the Phenergan.  I think that she is stable for discharge.  She is tolerating p.o. prior to discharge.  She is encouraged to follow-up with her neurologist.  Amount and/or Complexity of Data Reviewed Labs: ordered. Radiology: ordered.           Final Clinical Impression(s) / ED Diagnoses Final diagnoses:  Dizziness    Rx / DC Orders ED Discharge Orders          Ordered    promethazine (PHENERGAN) 25 MG tablet  Every 6 hours PRN        12/01/22 2127    ketorolac (TORADOL) 10 MG tablet  Every 6 hours PRN        12/01/22 2127              Durwin Glaze, MD 12/01/22 2127

## 2022-12-01 NOTE — ED Triage Notes (Signed)
Patient was treated for vertigo 2 weeks ago after falling on her head. Feels like she is not improving. Was diagnosed with a concussion. Has a knot in her neck. Unable to keep food down. Feels dehydrated.

## 2022-12-01 NOTE — Telephone Encounter (Signed)
Who's calling (name and relationship to patient) : Aniceto Boss; mom   Best contact number: [phone]  Provider they see: Rudean Haskell [place], NP   Reason for call: Mom called in stating that Kevia is in bad shape, that she has gotten worse. Mom is scared. She is requesting a call back from [facility] asap.    Call ID:      PRESCRIPTION REFILL ONLY  Name of prescription:  Pharmacy:

## 2022-12-01 NOTE — ED Notes (Signed)
Patient tried to give urine sample but there was not enough given to make a sample.Patient stated she was nervous.

## 2022-12-01 NOTE — Discharge Instructions (Addendum)
Please try the meclizine again.  Take Phenergan as needed for nausea.  Try the ketorolac for headache.  Do not take ibuprofen, Aleve, or other anti-inflammatory medications with this is a counteract.  Please call your neurologist tomorrow to see if they would like to make any changes in your medications or see you sooner.  Return to the ER for worsening symptoms.

## 2022-12-01 NOTE — Telephone Encounter (Signed)
Contacted patients mother.  Verified patients name and DOB as well as mothers name.   Mom stated that the patient can't eat, she's very dizzy and is getting worse.   Mom thinks theres something more than a concussion. Mom states that she's been vomiting off and on for 4 weeks. Patient vomited or about an hour and a half straight on Saturday. Mom says the patient doesn't have much of anything on her stomach and she's very dehydrated.   Patient is unable to walk straight, she sways when she walks.  Mom says she's lost about 5 pounds in two weeks.   Mom has not taken patient to the ER, PCP or Urgent care since last seen by Mrs. Inetta Fermo.  Informed mom that I would relay the message to Mrs. Inetta Fermo. Mom verbalized understanding of this.  SS, CCMA

## 2022-12-02 NOTE — Telephone Encounter (Signed)
I called to check on Bonnie Simon from her ER visit last night. Mom said that she felt better this morning but this afternoon is lying down again and reporting room spinning and dizziness. I recommended increasing fluid intake and starting physical therapy. Mom agreed with this plan. TG

## 2022-12-02 NOTE — Addendum Note (Signed)
Addended by: Princella Ion on: 12/02/2022 05:14 PM   Modules accepted: Orders

## 2022-12-10 ENCOUNTER — Ambulatory Visit (HOSPITAL_COMMUNITY): Payer: BC Managed Care – PPO | Attending: Family

## 2022-12-10 ENCOUNTER — Other Ambulatory Visit: Payer: Self-pay

## 2022-12-10 DIAGNOSIS — R42 Dizziness and giddiness: Secondary | ICD-10-CM | POA: Insufficient documentation

## 2022-12-10 DIAGNOSIS — S0990XA Unspecified injury of head, initial encounter: Secondary | ICD-10-CM | POA: Diagnosis not present

## 2022-12-10 NOTE — Therapy (Addendum)
OUTPATIENT PHYSICAL THERAPY VESTIBULAR EVALUATION     Patient Name: Bonnie Simon MRN: [id number] DOB:01/15/2001, 22 y.o., female Today's Date: 12/10/2022 PHYSICAL THERAPY DISCHARGE SUMMARY  Visits from Start of Care: 1  Current functional level related to goals / functional outcomes: See below   Remaining deficits: See below   Education / Equipment: See below   Patient agrees to discharge. Patient goals were  not set in this facility as patient is going to seek care on another facility . Patient is being discharged due to  patient being referred to another facility for further evaluation and management of concussion injury..   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  ONSET DATE: 5 weeks ago  Rationale for Evaluation and Treatment: Rehabilitation  SUBJECTIVE:   SUBJECTIVE STATEMENT: Arrives to the clinic with c/o spinning sensation on her "bad" days. However, patient denies "spinning" sensation on the time of evaluation. Spinning is triggered mostly by staring at a stationary object for too long. Reports with difficulty with working on the computer and using phone. Patient also reports of headache (mostly on the back of the head) almost everyday (denies headache at the time of evaluation). Headache is triggered by playing loud music (2-3/10 pain). Denies nausea/vomiting. Condition started 5 weeks ago when she was doing a TikTok trend and hit her head on the ground. Reports that her neck was hyperextended. Patient felt neck pain that night. The next day, patient had a spinning sensation. Patient then laid for the whole day to help with the symptoms. Patient went to the urgent care 5 days after. Patient was treated for vertigo and was  given Zofran which did not help.  Went to her primary care a few days after and was given meclizine which is somehow helping. Patient then went to her neurologist and was thought to have a concussion. Patient was told that the brain will heal on its own. 2 weeks later, patient went to the ER as the symptoms are still bothering her. CT scan was done (see results below) and patient was formally diagnosed with concussion injury. Neurologist  called mother and referred patient to outpatient PT evaluation and management. Pt accompanied by:  mother  PERTINENT HISTORY: Cerebral palsy (L side weaker)  PAIN:  Are you having pain? Yes: NPRS scale: 4/10 Pain location: neck Pain description: aching, intermittent Aggravating factors: patient does not know Relieving factors: patient does not know  PRECAUTIONS: None  RED FLAGS: Compression fracture: No   WEIGHT BEARING RESTRICTIONS: No  FALLS: Has patient fallen in last 6 months? No  LIVING ENVIRONMENT: Lives with:  parents Lives in: House/apartment Stairs: Yes: External: 4 steps; bilateral but cannot reach both Has following equipment at home: None  PLOF: Independent and Independent with basic ADLs  PATIENT GOALS: "driving and hanging with my friends again"  OBJECTIVE:  Note: Objective measures were completed at Evaluation unless otherwise noted.  DIAGNOSTIC FINDINGS: 12/01/22 EXAM: CT HEAD WITHOUT CONTRAST   CT CERVICAL SPINE WITHOUT CONTRAST   TECHNIQUE: Multidetector CT imaging of the head and cervical spine was performed following the standard protocol without intravenous contrast. Multiplanar CT image reconstructions of the cervical spine were also generated.   RADIATION DOSE REDUCTION: This exam was performed according to the departmental dose-optimization program which includes automated exposure control, adjustment of the mA and/or kV according to patient size and/or use of iterative reconstruction technique.   COMPARISON:  CT head 07/22/2006, MRI head 02/07/2004   FINDINGS: CT HEAD FINDINGS   Brain:   No evidence of large-territorial acute infarction. No parenchymal hemorrhage. No mass lesion. No extra-axial collection.   No mass effect or midline shift. Stable asymmetrically enlarged right lateral ventricle compared to the left-chronic. No hydrocephalus. Basilar cisterns are patent.   Vascular: No hyperdense vessel.   Skull: No acute  fracture or focal lesion.   Sinuses/Orbits: Paranasal sinuses and mastoid air cells are clear. The orbits are unremarkable.   Other: None.   CT CERVICAL SPINE FINDINGS   Alignment: Normal.   Skull base and vertebrae: No acute fracture. No aggressive appearing focal osseous lesion or focal pathologic process.   Soft tissues and spinal canal: No prevertebral fluid or swelling. No visible canal hematoma.   Upper chest: Unremarkable.   Other: None.   IMPRESSION: 1. No acute intracranial abnormality. 2. No acute displaced fracture or traumatic listhesis of the cervical spine.  COGNITION: Overall cognitive status: Within functional limits for tasks assessed   SENSATION: Not tested   MUSCLE TONE:  LLE: Hypertonic (patient has cerebral palsy)   POSTURE:  rounded shoulders, forward head, and increased thoracic kyphosis  Cervical ROM:    Active A/PROM (deg) eval  Flexion 40  Extension 50  Right lateral flexion 20  Left lateral flexion 20  Right rotation 60  Left rotation 60  (Blank rows = not tested)  STRENGTH: All major muscles groups of B UE are grossly graded 5/5 except for L elbow flex/ext and L grip = 3+/5. Weakness is attributed to patient's cerebral palsy  LOWER EXTREMITY MMT:   MMT Right eval Left eval  Hip flexion 5 4  Hip abduction 5 4  Hip adduction 5 4  Hip internal rotation    Hip external rotation    Knee flexion 5 4  Knee extension 5 4  Ankle dorsiflexion 5 0  Ankle plantarflexion 5 4  Ankle inversion    Ankle eversion    (Blank rows = not tested)  BED MOBILITY:  Sit to supine Complete Independence Supine to sit Complete Independence Rolling to Right Complete Independence Rolling to Left Complete Independence  TRANSFERS: Assistive device utilized: None  Sit to stand: Complete Independence Stand to sit: Complete Independence  GAIT: Gait pattern: decreased arm swing- Left, decreased hip/knee flexion- Left, Left hip hike, Left  steppage, and poor foot clearance- Left Distance walked: around 30 ft Assistive device utilized: None Level of assistance: Complete Independence   FUNCTIONAL TESTS:  5 times sit to stand: 12.73 sec Dynamic Gait Index: 16 DGI 1. Gait level surface (2) Mild Impairment: Walks 20', uses assistive devices, slower speed, mild gait deviations. 2. Change in gait speed (2) Mild Impairment: Is able to change speed but demonstrates mild gait deviations, or not gait deviations but unable to achieve a significant change in velocity, or uses an assistive device. 3. Gait with horizontal head turns (2) Mild Impairment: Performs head turns smoothly with slight change in gait velocity, i.e., minor disruption to smooth gait path or uses walking aid. 4. Gait with vertical head turns (2) Mild Impairment: Performs head turns smoothly with slight change in gait velocity, i.e., minor disruption to smooth gait path or uses walking aid. 5. Gait and pivot turn (2) Mild Impairment: Pivot turns safely in > 3 seconds and stops with no loss of balance. 6. Step over obstacle (2) Mild Impairment: Is able to step over box, but must slow down and adjust steps to clear box safely. 7. Step around obstacles (2) Mild Impairment: Is able to step around both cones, but must slow down and adjust steps to clear cones. 8. Stairs (2) Mild Impairment: Alternating feet, must use rail.  TOTAL SCORE: 16 / 24   VESTIBULAR ASSESSMENT:     SYMPTOM BEHAVIOR:  Subjective history: Condition started 5 weeks ago when she was doing a TikTok trend and hit her head on the ground. Reports that her neck was hyperextended.   Non-Vestibular symptoms: neck pain and headaches  Type of dizziness: Spinning/Vertigo  Frequency: unknown  Duration: can last for hours  Aggravating factors:  unknown  Relieving factors: no known relieving factors  Progression of symptoms: better  OCULOMOTOR EXAM:  Ocular Alignment: normal  Ocular ROM: No  Limitations  Smooth Pursuits: intact  Saccades: intact  VESTIBULAR - OCULAR REFLEX:   Slow VOR: Normal  VOR Cancellation: Normal  Head-Impulse Test: HIT Right: negative HIT Left: negative (negative indicates intact saccades with no c/o dizziness/spinning) Oculomotor reflex (R & L): intact     POSITIONAL TESTING: Right Dix-Hallpike: no nystagmus and no vertigo reported Left Dix-Hallpike: no nystagmus and no vertigo reported Right Roll Test: no nystagmus and no vertigo reported Left Roll Test: no nystagmus and no vertigo reported  MOTION SENSITIVITY:  Motion Sensitivity Quotient Intensity: 0 = none, 1 = Lightheaded, 2 = Mild, 3 = Moderate, 4 = Severe, 5 = Vomiting  Intensity  1. Sitting to supine 0  2. Supine to L side   3. Supine to R side   4. Supine to sitting 0  5. L Hallpike-Dix 0  6. Up from L    7. R Hallpike-Dix 0  8. Up from R    9.  Sitting, head tipped to L knee 0  10. Head up from L knee 0  11. Sitting, head tipped to R knee 0  12. Head up from R knee 0  13. Sitting head turns x5 0  14.Sitting head nods x5   15. In stance, 180 turn to L  0  16. In stance, 180 turn to R 0    CERVICOGENIC DIZZINESS/HEADACHES EVALUATION (-) Sharp-purser test (-) Transverse ligament test Cervical flexion test: < 20 mmHg (can only hold for 1 second) Cervical joint proprioception sense (eyes closed):  Cervical rotation R: initial 60 deg, final: 65 deg Cervical rotation L: initial 40 deg, final 50 degrees Grade 2 tenderness on B upper trapezius and suboccipitals Moderate muscle spasm and restriction on B upper trapezius and suboccipitals   VESTIBULAR TREATMENT:                                                                                                   DATE:  12/10/22 Evaluation and patient education   PATIENT EDUCATION: Education details: Educated on the pathoanatomy of concussion and vertigo. Education on the need to be seen by a PT specializing in concussion  injuries Person educated: Patient and Parent Education method: Explanation Education comprehension: verbalized understanding  HOME EXERCISE PROGRAM: None provided  ASSESSMENT:  CLINICAL IMPRESSION: Patient is a 22 y.o. female who was seen today for physical therapy evaluation and treatment for vertigo and S/P closed injury of the head. Based on the findings above, patient's condition is not brought about by impaired semicircular canals as patient is negative on the Dix-Hallpike and Supine roll test. Patient's condition may be brought about by impaired cervical proprioception and cervical instability. However, patient does not present with cervical instability as indicated by a negative Sharp-purser test and negative transverse ligament test. Condition may be brought about by upper motor neuron lesion following trauma to the head so patient may need to be seen and further evaluated by a PT experienced in concussion injuries to address impairments and functional limitations listed below. Informed patient and parent about the presence of concussion specialist in a [facility] in [place] and [place] to which they can seek care. Patient and mother gave excellent verbal understanding. With this, patient is discharged from skilled PT services in this facility in order for them to seek care from a PT experienced in concussion rehabilitation.  OBJECTIVE IMPAIRMENTS: decreased ROM, decreased strength, dizziness, impaired flexibility, postural dysfunction, pain, and impaired proprioception .   ACTIVITY LIMITATIONS: carrying, lifting, bending, sitting, standing, and squatting  PARTICIPATION LIMITATIONS: meal prep, cleaning, laundry, driving, community activity, and school  PERSONAL FACTORS: 1-2 comorbidities: ASD, cerebral palsy  are also affecting patient's functional outcome.   REHAB POTENTIAL: to be determined by concussion specialist  CLINICAL DECISION  MAKING: Evolving/moderate complexity  EVALUATION COMPLEXITY: Moderate   PLAN:  PT FREQUENCY:  0  PT DURATION: other: 0  PLANNED INTERVENTIONS:  D/C from skilled PT from this facility. Refer patient to a PT experienced in concussion rehabilitation   Iantha Fallen L. Jak Haggar, PT, DPT, OCS Board-Certified Clinical Specialist in  Orthopedic PT PT Compact Privilege # ([place]): [phone] T  12/10/2022, 9:33 AM

## 2022-12-12 ENCOUNTER — Telehealth (INDEPENDENT_AMBULATORY_CARE_PROVIDER_SITE_OTHER): Payer: Self-pay | Admitting: Family

## 2022-12-12 MED ORDER — KETOROLAC TROMETHAMINE 10 MG PO TABS: 10.0000 mg | ORAL_TABLET | Freq: Four times a day (QID) | ORAL | 0 refills | Status: DC | PRN

## 2022-12-12 NOTE — Telephone Encounter (Signed)
  Name of who is calling: Angela  Caller's Relationship to Patient: mom   Best contact number:  Provider they see:  Reason for call: Mom stated that Giah is improving from the concussion but she wanted to know if Rx, Ketorolac 10 mg could be refilled because it is really helping her recover. This Rx was prescribed to her by the ED doctor.     PRESCRIPTION REFILL ONLY  Name of prescription: Ketorolac 10 mg  Pharmacy: [facility]

## 2022-12-12 NOTE — Telephone Encounter (Signed)
Contacted patients mother to inform her of the previous message from provider.   Mother verbalized understanding of this.   SS, CCMA

## 2022-12-12 NOTE — Telephone Encounter (Signed)
Please let Mom know that I sent in the refill. Thanks, Inetta Fermo

## 2022-12-17 ENCOUNTER — Ambulatory Visit (HOSPITAL_COMMUNITY): Payer: BC Managed Care – PPO

## 2022-12-18 DIAGNOSIS — N200 Calculus of kidney: Secondary | ICD-10-CM | POA: Diagnosis not present

## 2022-12-23 DIAGNOSIS — Z01818 Encounter for other preprocedural examination: Secondary | ICD-10-CM | POA: Diagnosis not present

## 2023-01-02 DIAGNOSIS — N368 Other specified disorders of urethra: Secondary | ICD-10-CM | POA: Diagnosis not present

## 2023-01-02 DIAGNOSIS — Z79899 Other long term (current) drug therapy: Secondary | ICD-10-CM | POA: Diagnosis not present

## 2023-01-02 DIAGNOSIS — N3592 Unspecified urethral stricture, female: Secondary | ICD-10-CM | POA: Diagnosis not present

## 2023-01-02 DIAGNOSIS — K219 Gastro-esophageal reflux disease without esophagitis: Secondary | ICD-10-CM | POA: Diagnosis not present

## 2023-01-02 DIAGNOSIS — F840 Autistic disorder: Secondary | ICD-10-CM | POA: Diagnosis not present

## 2023-01-02 DIAGNOSIS — F411 Generalized anxiety disorder: Secondary | ICD-10-CM | POA: Diagnosis not present

## 2023-01-02 DIAGNOSIS — G808 Other cerebral palsy: Secondary | ICD-10-CM | POA: Diagnosis not present

## 2023-01-02 DIAGNOSIS — N200 Calculus of kidney: Secondary | ICD-10-CM | POA: Diagnosis not present

## 2023-01-02 DIAGNOSIS — N2889 Other specified disorders of kidney and ureter: Secondary | ICD-10-CM | POA: Diagnosis not present

## 2023-01-02 HISTORY — PX: LITHOTRIPSY: SUR834

## 2023-01-12 DIAGNOSIS — N200 Calculus of kidney: Secondary | ICD-10-CM | POA: Diagnosis not present

## 2023-01-30 DIAGNOSIS — N2889 Other specified disorders of kidney and ureter: Secondary | ICD-10-CM | POA: Diagnosis not present

## 2023-01-30 DIAGNOSIS — N368 Other specified disorders of urethra: Secondary | ICD-10-CM | POA: Diagnosis not present

## 2023-01-30 DIAGNOSIS — Z79899 Other long term (current) drug therapy: Secondary | ICD-10-CM | POA: Diagnosis not present

## 2023-01-30 DIAGNOSIS — K219 Gastro-esophageal reflux disease without esophagitis: Secondary | ICD-10-CM | POA: Diagnosis not present

## 2023-01-30 DIAGNOSIS — N3592 Unspecified urethral stricture, female: Secondary | ICD-10-CM | POA: Diagnosis not present

## 2023-01-30 DIAGNOSIS — G44219 Episodic tension-type headache, not intractable: Secondary | ICD-10-CM | POA: Diagnosis not present

## 2023-01-30 DIAGNOSIS — F840 Autistic disorder: Secondary | ICD-10-CM | POA: Diagnosis not present

## 2023-01-30 DIAGNOSIS — N132 Hydronephrosis with renal and ureteral calculous obstruction: Secondary | ICD-10-CM | POA: Diagnosis not present

## 2023-01-30 DIAGNOSIS — G40109 Localization-related (focal) (partial) symptomatic epilepsy and epileptic syndromes with simple partial seizures, not intractable, without status epilepticus: Secondary | ICD-10-CM | POA: Diagnosis not present

## 2023-01-30 DIAGNOSIS — G40409 Other generalized epilepsy and epileptic syndromes, not intractable, without status epilepticus: Secondary | ICD-10-CM | POA: Diagnosis not present

## 2023-01-30 DIAGNOSIS — N200 Calculus of kidney: Secondary | ICD-10-CM | POA: Diagnosis not present

## 2023-01-30 DIAGNOSIS — N135 Crossing vessel and stricture of ureter without hydronephrosis: Secondary | ICD-10-CM | POA: Diagnosis not present

## 2023-02-04 DIAGNOSIS — N200 Calculus of kidney: Secondary | ICD-10-CM | POA: Diagnosis not present

## 2023-02-04 DIAGNOSIS — Z466 Encounter for fitting and adjustment of urinary device: Secondary | ICD-10-CM | POA: Diagnosis not present

## 2023-02-06 ENCOUNTER — Telehealth (INDEPENDENT_AMBULATORY_CARE_PROVIDER_SITE_OTHER): Payer: Self-pay | Admitting: Family

## 2023-02-06 NOTE — Telephone Encounter (Signed)
  Name of who is calling: Riley Kill Relationship to Patient: mom   Best contact number: [phone]  Provider they see:  Inetta Fermo   Reason for call: Mom called regarding DMV paper work needing to be filled out by Inetta Fermo, mom says she usually has to do it every year. Says she will fax documents Monday afternoon.      PRESCRIPTION REFILL ONLY  Name of prescription:  Pharmacy:

## 2023-02-09 NOTE — Telephone Encounter (Signed)
Mom called in stating that she is on her way to the office to drop off DMV paper work.

## 2023-02-09 NOTE — Telephone Encounter (Signed)
Mom will fax them instead.

## 2023-03-09 ENCOUNTER — Encounter: Payer: Self-pay | Admitting: Internal Medicine

## 2023-03-09 ENCOUNTER — Ambulatory Visit: Payer: BC Managed Care – PPO | Admitting: Internal Medicine

## 2023-03-09 VITALS — BP 133/88 | HR 112 | Ht <= 58 in | Wt 101.4 lb

## 2023-03-09 DIAGNOSIS — J208 Acute bronchitis due to other specified organisms: Secondary | ICD-10-CM | POA: Diagnosis not present

## 2023-03-09 DIAGNOSIS — B9689 Other specified bacterial agents as the cause of diseases classified elsewhere: Secondary | ICD-10-CM | POA: Insufficient documentation

## 2023-03-09 MED ORDER — BENZONATATE 100 MG PO CAPS
100.0000 mg | ORAL_CAPSULE | Freq: Two times a day (BID) | ORAL | 0 refills | Status: DC | PRN
Start: 2023-03-09 — End: 2023-05-07

## 2023-03-09 MED ORDER — AZITHROMYCIN 250 MG PO TABS
ORAL_TABLET | ORAL | 0 refills | Status: DC
Start: 2023-03-09 — End: 2023-05-07

## 2023-03-09 NOTE — Assessment & Plan Note (Signed)
 Presenting today for an acute visit endorsing a nearly 2-week history of symptoms endorsed above.  Overall, sinus congestion is improving and her chief concern is a persistent cough.  Symptoms initially improved, but cough has recently worsened.  She endorses fatigue as well. -Treatment options reviewed.  Tessalon  Perles were added for as needed cough relief.  Will also prescribe a Z-Pak for empiric antibiotic coverage for possible bacterial bronchitis.  She was instructed to continue additional supportive care measures.  She will return to care if symptoms worsen or fail to improve.

## 2023-03-09 NOTE — Patient Instructions (Signed)
 It was a pleasure to see you today.  Thank you for giving us  the opportunity to be involved in your care.  Below is a brief recap of your visit and next steps.  We will plan to see you again in 6 weeks.  Summary Z pak and tessalon  perles added today Follow up with Dr. Tobie for routine care in 6 weeks

## 2023-03-09 NOTE — Progress Notes (Signed)
 Acute Office Visit  Subjective:     Patient ID: Bonnie Simon, female    DOB: [birth year], 23 y.o.   MRN: [id number]  Chief Complaint  Patient presents with   Cough    Coughing has been taking sudafed and nyquil no relief. Coughing since christmas    Bonnie Simon presents today for an acute visit endorsing a nearly 2-week history of cough and sinus congestion.  Multiple family members have been sick as well.  She has not completed COVID-19/flu testing.  Most bothersome, is a persistent dry cough.  Sinus congestion is gradually improving.  Nasal secretions are clear.  She denies throat irritation, fever/chills, nausea/vomiting, and diarrhea..  She feels fatigued in general.  She describes a burning chest discomfort at night that is relieved with Pepcid .  Bonnie Simon has been taking NyQuil/DayQuil and cough drops as needed for symptom relief.  Review of Systems  Constitutional:  Positive for malaise/fatigue. Negative for fever.  HENT:  Positive for congestion and sinus pain. Negative for ear pain and sore throat.   Respiratory:  Positive for cough. Negative for sputum production and shortness of breath.   Gastrointestinal:  Negative for diarrhea, nausea and vomiting.      Objective:    BP 133/88 (BP Location: Right Arm, Patient Position: Sitting, Cuff Size: Normal)   Pulse (!) 112   Ht 4' 10 (1.473 m)   Wt 101 lb 6.4 oz (46 kg)   SpO2 96%   BMI 21.19 kg/m   Physical Exam Constitutional:      General: She is not in acute distress.    Appearance: Normal appearance. She is not toxic-appearing.  HENT:     Head: Normocephalic and atraumatic.     Right Ear: External ear normal.     Left Ear: External ear normal.     Nose: Congestion and rhinorrhea present.     Mouth/Throat:     Mouth: Mucous membranes are moist.     Pharynx: Oropharynx is clear. No oropharyngeal exudate or posterior oropharyngeal erythema.  Eyes:     General: No scleral icterus.    Extraocular Movements:  Extraocular movements intact.     Conjunctiva/sclera: Conjunctivae normal.     Pupils: Pupils are equal, round, and reactive to light.  Cardiovascular:     Rate and Rhythm: Normal rate and regular rhythm.     Pulses: Normal pulses.     Heart sounds: Normal heart sounds. No murmur heard.    No friction rub. No gallop.  Pulmonary:     Effort: Pulmonary effort is normal.     Breath sounds: Normal breath sounds. No wheezing, rhonchi or rales.  Abdominal:     General: Abdomen is flat. Bowel sounds are normal. There is no distension.     Palpations: Abdomen is soft.     Tenderness: There is no abdominal tenderness.  Musculoskeletal:        General: No swelling. Normal range of motion.     Cervical back: Normal range of motion.     Right lower leg: No edema.     Left lower leg: No edema.  Lymphadenopathy:     Cervical: No cervical adenopathy.  Skin:    General: Skin is warm and dry.     Capillary Refill: Capillary refill takes less than 2 seconds.     Coloration: Skin is not jaundiced.  Neurological:     General: No focal deficit present.     Mental Status: She is alert and oriented  to person, place, and time.  Psychiatric:        Mood and Affect: Mood normal.        Behavior: Behavior normal.     No results found for any visits on 03/09/23.      Assessment & Plan:   Problem List Items Addressed This Visit       Acute bacterial bronchitis - Primary   Presenting today for an acute visit endorsing a nearly 2-week history of symptoms endorsed above.  Overall, sinus congestion is improving and her chief concern is a persistent cough.  Symptoms initially improved, but cough has recently worsened.  She endorses fatigue as well. -Treatment options reviewed.  Tessalon  Perles were added for as needed cough relief.  Will also prescribe a Z-Pak for empiric antibiotic coverage for possible bacterial bronchitis.  She was instructed to continue additional supportive care measures.  She will  return to care if symptoms worsen or fail to improve.       Meds ordered this encounter  Medications   azithromycin  (ZITHROMAX  Z-PAK) 250 MG tablet    Sig: Take 2 tablets (500 mg) PO today, then 1 tablet (250 mg) PO daily x4 days.    Dispense:  6 tablet    Refill:  0   benzonatate  (TESSALON ) 100 MG capsule    Sig: Take 1 capsule (100 mg total) by mouth 2 (two) times daily as needed for cough.    Dispense:  20 capsule    Refill:  0    Return in about 6 weeks (around 04/20/2023).  Manus FORBES Fireman, MD

## 2023-03-20 ENCOUNTER — Telehealth (INDEPENDENT_AMBULATORY_CARE_PROVIDER_SITE_OTHER): Payer: Self-pay | Admitting: Family

## 2023-03-20 NOTE — Telephone Encounter (Signed)
  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 The company's name - [employer] [facility] [phone] is the fax number; a 2 way consent form was sent to pt to fill out before form could be sent, pt is aware that we need the form first     PRESCRIPTION REFILL ONLY  Name of prescription:  Pharmacy:

## 2023-03-25 ENCOUNTER — Other Ambulatory Visit (INDEPENDENT_AMBULATORY_CARE_PROVIDER_SITE_OTHER): Payer: Self-pay | Admitting: Family

## 2023-03-25 DIAGNOSIS — G40309 Generalized idiopathic epilepsy and epileptic syndromes, not intractable, without status epilepticus: Secondary | ICD-10-CM

## 2023-03-25 DIAGNOSIS — G40209 Localization-related (focal) (partial) symptomatic epilepsy and epileptic syndromes with complex partial seizures, not intractable, without status epilepticus: Secondary | ICD-10-CM

## 2023-03-25 NOTE — Telephone Encounter (Signed)
The letter has been written and faxed to her future employer as requested. TG

## 2023-04-20 DIAGNOSIS — G808 Other cerebral palsy: Secondary | ICD-10-CM | POA: Diagnosis not present

## 2023-04-20 DIAGNOSIS — F411 Generalized anxiety disorder: Secondary | ICD-10-CM | POA: Diagnosis not present

## 2023-04-20 DIAGNOSIS — N200 Calculus of kidney: Secondary | ICD-10-CM | POA: Diagnosis not present

## 2023-04-22 ENCOUNTER — Ambulatory Visit (INDEPENDENT_AMBULATORY_CARE_PROVIDER_SITE_OTHER): Payer: BC Managed Care – PPO | Admitting: Family

## 2023-05-03 ENCOUNTER — Other Ambulatory Visit (INDEPENDENT_AMBULATORY_CARE_PROVIDER_SITE_OTHER): Payer: Self-pay | Admitting: Family

## 2023-05-03 DIAGNOSIS — F409 Phobic anxiety disorder, unspecified: Secondary | ICD-10-CM

## 2023-05-07 ENCOUNTER — Ambulatory Visit (HOSPITAL_COMMUNITY)
Admission: RE | Admit: 2023-05-07 | Discharge: 2023-05-07 | Disposition: A | Discharge status: Home / Self Care | Source: Ambulatory Visit | Attending: Internal Medicine | Admitting: Internal Medicine

## 2023-05-07 ENCOUNTER — Ambulatory Visit: Payer: Self-pay | Admitting: Internal Medicine

## 2023-05-07 ENCOUNTER — Encounter: Payer: Self-pay | Admitting: Internal Medicine

## 2023-05-07 VITALS — BP 128/85 | HR 89 | Ht 59.0 in | Wt 103.4 lb

## 2023-05-07 DIAGNOSIS — M419 Scoliosis, unspecified: Secondary | ICD-10-CM | POA: Insufficient documentation

## 2023-05-07 DIAGNOSIS — Z0001 Encounter for general adult medical examination with abnormal findings: Secondary | ICD-10-CM | POA: Diagnosis not present

## 2023-05-07 DIAGNOSIS — N200 Calculus of kidney: Secondary | ICD-10-CM

## 2023-05-07 DIAGNOSIS — F411 Generalized anxiety disorder: Secondary | ICD-10-CM

## 2023-05-07 DIAGNOSIS — G8929 Other chronic pain: Secondary | ICD-10-CM | POA: Insufficient documentation

## 2023-05-07 DIAGNOSIS — G40309 Generalized idiopathic epilepsy and epileptic syndromes, not intractable, without status epilepticus: Secondary | ICD-10-CM

## 2023-05-07 DIAGNOSIS — M898X1 Other specified disorders of bone, shoulder: Secondary | ICD-10-CM

## 2023-05-07 DIAGNOSIS — M4186 Other forms of scoliosis, lumbar region: Secondary | ICD-10-CM | POA: Diagnosis not present

## 2023-05-07 DIAGNOSIS — M545 Low back pain, unspecified: Secondary | ICD-10-CM | POA: Diagnosis not present

## 2023-05-07 NOTE — Assessment & Plan Note (Signed)
Well controlled with Tegretol Denies any recent seizure like activity Followed by pediatric neurology, she prefers to stay with her current neurologist.

## 2023-05-07 NOTE — Assessment & Plan Note (Signed)
Physical exam as documented. ?Fasting blood tests ordered today. ?

## 2023-05-07 NOTE — Progress Notes (Signed)
 Established Patient Office Visit  Subjective:  Patient ID: Bonnie Simon, female    DOB: 05/07/2000  Age: 23 y.o. MRN: [id number]  CC:  Chief Complaint  Patient presents with   Back Pain    Back and torso pain   Annual Exam    HPI Bonnie Simon is a 23 y.o. female with past medical history of generalized convulsive epilepsy, neonatal intraventricular hemorrhage, GAD, GERD and nephrolithiasis who presents for annual physical.  She sees pediatric neurology for history of generalized convulsive epilepsy and GAD. She is currently on Tegretol for epilepsy. She takes Prozac for GAD. She takes clonidine and trazodone for insomnia. She reports chronic anxiety. She is currently enrolled in radiology tech school and is working as Engineer, agricultural.  She had ureteral stent placement and later stone retraction for nephrolithiasis.  Followed by urology currently - takes hydrochlorothiazide and potassium supplement.  She works chronic left scapular area pain and low back pain.  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  She works as a Engineer, agricultural and has to provide support to her patients at times, which can provoke the scapular area pain and low back pain.  She has tried taking Tylenol and ibuprofen with mild relief.   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 Kennon Portela at [facility]    Family History  Problem Relation Age of Onset   Heart Problems Paternal Grandfather        Died at 37    Social History   Socioeconomic History   Marital status: Single    Spouse name: Not on file   Number of children: Not on file   Years of education: Not  on file   Highest education level: Not on file  Occupational History   Not on file  Tobacco Use   Smoking status: Never    Passive exposure: Yes   Smokeless tobacco: Never  Substance and Sexual Activity   Alcohol use: No   Drug use: No   Sexual activity: Never  Other Topics Concern   Not on file  Social History Narrative   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   She attends [employer].    Adelee enjoys playing marching band, basketball, soccer, and watching TV.   Veva lives with her parents. She has an adult sister that does not live at home.   Social Drivers of Corporate investment banker Strain: Not on file  Food Insecurity: Not on file  Transportation Needs: Not on file  Physical Activity: Not on file  Stress: Not on file  Social Connections: Unknown (02/18/2022)   Received from [facility], [facility]   Social Network    Social Network: Not on file  Intimate Partner Violence: Unknown (02/18/2022)   Received from [facility], [facility]   HITS    Physically Hurt: Not on file    Insult or Talk Down To: Not on file    Threaten Physical Harm: Not on file    Scream or Curse: Not on file    Outpatient Medications Prior to Visit  Medication  Sig Dispense Refill   carbamazepine (TEGRETOL) 100 MG chewable tablet CHEW 2 TABLETS BY MOUTH IN THE MORNING, 2 TABLETS IN THE AFTERNOON, AND 3 TABLETS AT BEDTIME. 210 tablet 5   cloNIDine (CATAPRES) 0.1 MG tablet TAKE 1 AND 1/2 TABLETS BY MOUTH 30 MINUTES BEFORE BEDTIME. 45 tablet 1   famotidine (PEPCID) 10 MG tablet Take 1 tablet (10 mg total) by mouth 2 (two) times daily. 60 tablet 2   FLUoxetine (PROZAC) 20 MG/5ML solution TAKE 4 MLS BY MOUTH ONCE DAILY. (Patient not taking: Reported on 11/17/2022) 360 mL 1   hydrochlorothiazide (HYDRODIURIL) 12.5 MG tablet Take 1 tablet by mouth daily. (Patient not taking: Reported on 11/17/2022)     ibuprofen (ADVIL) 200 MG tablet Take 1 tablet at onset of headache, may  repeat in 6 hours if needed. (Patient not taking: Reported on 11/17/2022) 30 tablet 0   ketorolac (TORADOL) 10 MG tablet Take 1 tablet (10 mg total) by mouth every 6 (six) hours as needed. 20 tablet 0   meclizine (ANTIVERT) 12.5 MG tablet Take 1 tablet (12.5 mg total) by mouth 3 (three) times daily as needed for dizziness. 30 tablet 0   Melatonin 10 MG TABS Take 1 tablet at bedtime     ondansetron (ZOFRAN-ODT) 4 MG disintegrating tablet Take 1 tablet (4 mg total) by mouth every 8 (eight) hours as needed for nausea or vomiting. 15 tablet 0   potassium citrate (UROCIT-K) 10 MEQ (1080 MG) SR tablet Take by mouth. (Patient not taking: Reported on 11/17/2022)     traZODone (DESYREL) 50 MG tablet TAKE 1 & 1/2 TABLETS BY MOUTH 30 MINS BEFORE BEDTIME. 45 tablet 6   azithromycin (ZITHROMAX Z-PAK) 250 MG tablet Take 2 tablets (500 mg) PO today, then 1 tablet (250 mg) PO daily x4 days. 6 tablet 0   benzonatate (TESSALON) 100 MG capsule Take 1 capsule (100 mg total) by mouth 2 (two) times daily as needed for cough. 20 capsule 0   Melatonin 10 MG TABS Take by mouth. (Patient not taking: Reported on 11/17/2022)     omeprazole (PRILOSEC) 40 MG capsule Take 40 mg by mouth every morning. (Patient not taking: Reported on 11/17/2022)     promethazine (PHENERGAN) 25 MG tablet Take 1 tablet (25 mg total) by mouth every 6 (six) hours as needed for nausea or vomiting. 30 tablet 0   No facility-administered medications prior to visit.    Allergies  Allergen Reactions   Amoxicillin Rash    ROS Review of Systems  Constitutional:  Negative for chills and fever.  HENT:  Negative for congestion, sinus pressure, sinus pain and sore throat.   Eyes:  Negative for pain and discharge.  Respiratory:  Negative for cough and shortness of breath.   Cardiovascular:  Negative for chest pain and palpitations.  Gastrointestinal:  Negative for diarrhea, nausea and vomiting.  Endocrine: Negative for polydipsia and polyuria.   Genitourinary:  Negative for dysuria and hematuria.  Musculoskeletal:  Positive for back pain and myalgias. Negative for neck pain and neck stiffness.  Skin:  Negative for rash.  Neurological:  Negative for dizziness and weakness.  Psychiatric/Behavioral:  Positive for sleep disturbance. Negative for agitation and behavioral problems. The patient is nervous/anxious.       Objective:    Physical Exam Vitals reviewed.  Constitutional:      General: She is not in acute distress.    Appearance: She is not diaphoretic.  HENT:     Head: Normocephalic and atraumatic.  Nose: Nose normal.     Mouth/Throat:     Mouth: Mucous membranes are moist.  Eyes:     General: No scleral icterus.    Extraocular Movements: Extraocular movements intact.  Cardiovascular:     Rate and Rhythm: Normal rate and regular rhythm.     Pulses: Normal pulses.     Heart sounds: No murmur heard. Pulmonary:     Breath sounds: Normal breath sounds. No wheezing or rales.  Abdominal:     Palpations: Abdomen is soft.     Tenderness: There is no abdominal tenderness.  Musculoskeletal:     Cervical back: Neck supple. No tenderness.     Thoracic back: Tenderness (Right paraspinal/scapular border) present.     Lumbar back: Tenderness (Lower lumbar) present. Normal range of motion. Negative right straight leg raise test and negative left straight leg raise test.     Right lower leg: No edema.     Left lower leg: No edema.  Skin:    General: Skin is warm.     Findings: No rash.  Neurological:     General: No focal deficit present.     Mental Status: She is alert and oriented to person, place, and time.  Psychiatric:        Mood and Affect: Mood normal.        Behavior: Behavior normal.     BP 128/85 (BP Location: Right Arm, Patient Position: Sitting, Cuff Size: Normal)   Pulse 89   Ht 4\' 11"  (1.499 m)   Wt 103 lb 6.4 oz (46.9 kg)   LMP 05/04/2023   SpO2 98%   BMI 20.88 kg/m  Wt Readings from Last 3  Encounters:  05/07/23 103 lb 6.4 oz (46.9 kg)  03/09/23 101 lb 6.4 oz (46 kg)  12/01/22 98 lb (44.5 kg) (2%, Z= -2.00)*   * Growth percentiles are based on CDC (Girls, 2-20 Years) data.    Lab Results  Component Value Date   TSH 1.480 07/07/2022   Lab Results  Component Value Date   WBC 5.4 12/01/2022   HGB 15.3 (H) 12/01/2022   HCT 43.9 12/01/2022   MCV 94.6 12/01/2022   PLT 242 12/01/2022   Lab Results  Component Value Date   NA 138 12/01/2022   K 3.2 (L) 12/01/2022   CO2 27 12/01/2022   GLUCOSE 89 12/01/2022   BUN 9 12/01/2022   CREATININE 0.61 12/01/2022   ALT 14 01/09/2015   CALCIUM 8.9 12/01/2022   ANIONGAP 9 12/01/2022   EGFR 98 11/11/2022   No results found for: "CHOL" No results found for: "HDL" No results found for: "LDLCALC" No results found for: "TRIG" No results found for: "CHOLHDL" No results found for: "HGBA1C"    Assessment & Plan:   Problem List Items Addressed This Visit       Nervous and Auditory   Generalized convulsive epilepsy (HCC)   Well controlled with Tegretol Denies any recent seizure like activity Followed by pediatric neurology, she prefers to stay with her current neurologist.      Relevant Orders   CBC with Differential/Platelet   CMP14+EGFR   TSH     Musculoskeletal and Integument   Scoliosis of lumbar spine   Chronic low back pain, reports history of lumbar scoliosis Check x-ray of lumbar spine Avoid heavy lifting and frequent bending Heating pad and/or back brace as needed Tylenol as needed for pain      Relevant Orders   DG Lumbar Spine Complete  Genitourinary   Nephrolithiasis   Has h/o bilateral nephrolithiasis Has seen urologist at [facility] - had ureteral stone placement and later required stone extraction On hydrochlorothiazide and potassium supplement Needs to improve hydration and follow low-salt diet      Relevant Orders   CBC with Differential/Platelet   CMP14+EGFR     Other    Generalized anxiety disorder   Overall well-controlled with Prozac and trazodone currently Currently managed by pediatric neurology      Encounter for general adult medical examination with abnormal findings - Primary   Physical exam as documented. Fasting blood tests ordered today.      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   Follow-up: Return in about 1 year (around 05/06/2024).    Anabel Halon, MD

## 2023-05-07 NOTE — Assessment & Plan Note (Addendum)
 Has h/o bilateral nephrolithiasis Has seen urologist at [facility] - had ureteral stone placement and later required stone extraction On hydrochlorothiazide and potassium supplement Needs to improve hydration and follow low-salt diet

## 2023-05-07 NOTE — Assessment & Plan Note (Signed)
 Overall well-controlled with Prozac and trazodone currently Currently managed by pediatric neurology

## 2023-05-07 NOTE — Patient Instructions (Signed)
 Please take Ibuprofen 400 mg as needed for back pain. Please avoid heavy lifting and frequent bending.  Please continue to take medications as prescribed.  Please get fasting blood tests done after your Neurology visit.

## 2023-05-07 NOTE — Assessment & Plan Note (Signed)
 Chronic low back pain, reports history of lumbar scoliosis Check x-ray of lumbar spine Avoid heavy lifting and frequent bending Heating pad and/or back brace as needed Tylenol as needed for pain

## 2023-05-07 NOTE — Assessment & Plan Note (Signed)
 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

## 2023-05-12 ENCOUNTER — Other Ambulatory Visit (INDEPENDENT_AMBULATORY_CARE_PROVIDER_SITE_OTHER): Payer: Self-pay | Admitting: Family

## 2023-05-12 DIAGNOSIS — F409 Phobic anxiety disorder, unspecified: Secondary | ICD-10-CM

## 2023-05-16 ENCOUNTER — Other Ambulatory Visit (INDEPENDENT_AMBULATORY_CARE_PROVIDER_SITE_OTHER): Payer: Self-pay | Admitting: Family

## 2023-05-16 DIAGNOSIS — F411 Generalized anxiety disorder: Secondary | ICD-10-CM

## 2023-06-16 NOTE — Progress Notes (Unsigned)
 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 [name] and has remained seizure free since July 2013. She has a Information systems manager. [name] for anxiety, as well as Clonidine and Trazodone for insomnia.   Today's concerns: She reports today that she has had some seizure auras since her last visit. She feels that the events are triggered by stress Bonnie Simon admits to considerable day to day stress and asked about medication for it. She is on Fluoxetine and is interested in doing if increasing the dose would help.  She was seeing a therapist but the provider moved so she is looking for a new practice. She only wants in person therapy as she is uncomfortable with virtual sessions.  Bonnie Simon is interested in being a therapist and has changed her major to psychology. She has been accepted at [employer] for fall semester.  She reports ongoing back, shoulder area and flank pain. She has been seen by her PCP and urologist and no etiology was found.  Bonnie Simon reports waking at [facility] and being unable to return to sleep.  Bonnie Simon has been otherwise generally healthy since she was last seen. No health concerns today other than previously mentioned.  Review of systems: Please see HPI for neurologic and other pertinent review of  systems. Otherwise all other systems were reviewed and were negative.  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   Autism spectrum disorder 02/20/2017   Generalized anxiety disorder 07/05/2014   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   Partial epilepsy with impairment of consciousness (HCC) 11/05/2012   Episodic tension-type headache, not intractable 11/05/2012   Neonatal intraventricular hemorrhage 11/05/2012     Past Medical History:  Diagnosis Date   Anxiety    Autism    Cerebral palsy (HCC)    Delayed milestones 11/05/2012   Headache(784.0)    History of prematurity 2004   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 Kolaski at [facility]     Family history: family history includes Heart Problems in her paternal grandfather.   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   She attends [employer].    Bonnie Simon enjoys playing marching band, basketball, soccer, and watching TV.   Bonnie Simon lives with her parents. She has an adult sister that does not live at home.   Social Drivers of Corporate investment banker Strain: Not on file  Food Insecurity: Not on file  Transportation Needs: Not on file  Physical Activity: Not on file  Stress: Not on file  Social Connections: Unknown (02/18/2022)   Received from [facility], [facility]   Social Network    Social Network: Not on file  Intimate Partner Violence: Unknown (02/18/2022)   Received from [facility], [facility]   HITS    Physically Hurt: Not on file    Insult or Talk Down To: Not on file    Threaten Physical Harm: Not on file    Scream or Curse: Not on file    Past/failed meds:  Allergies: Allergies  Allergen Reactions   Amoxicillin Rash    Immunizations: Immunization History  Administered Date(s) Administered   DTaP / IPV 11/22/2007   Dtap, Unspecified 04/11/2003, 06/26/2003, 08/28/2003, 05/15/2004   HIB, Unspecified 04/12/2003, 06/26/2003, 08/28/2003, 05/15/2004   HPV 9-valent 07/13/2015, 01/29/2016   Hep A, Unspecified 08/21/2004, 11/22/2007   Hep B, Unspecified 04/12/2003, 05/18/2003, 06/26/2003, 11/29/2003   Influenza,inj,Quad PF,6+ Mos 03/04/2023   MMR 05/15/2004, 11/22/2007   MenQuadfi_Meningococcal Groups ACYW Conjugate 12/23/2019   Meningococcal B, OMV 12/23/2019, 02/28/2020   Meningococcal Conjugate 07/13/2015   PFIZER(Purple Top)SARS-COV-2 Vaccination 10/16/2019   Pneumococcal Conjugate PCV 7 08/28/2003   Polio, Unspecified 04/11/2003, 06/26/2003, 11/29/2003   Tdap 07/13/2015   Varicella 02/21/2004, 11/22/2007    Diagnostics/Screenings: Copied from previous record: 09/13/2013 - rEEG - This is a abnormal  record with the patient awake.  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  Hulda Mage. Hickling, M.D.   05/19/2007 - rEEG - Abnormal EEG on the basis of slowing over the right hemisphere, which is indicative of underlying structural and/or vascular  abnormality and would correlate with the patient's left hemiparesis. This may also be a result of a postictal change from a right brain  signature seizure. Marcelyn Servant, M.D.   07/22/2006 - CT Head wo contrast - Patient had a remote periventricular hemorrhage with subsequent dilatation of the right lateral ventricle appearring similar to prior MR. The present exam is motion degraded without obvious intracranial hemorrhage or new mass identified.  Followup MR without motion may be considered for further delineation for investigation of seizure focus when the patient is able to cooperate or be properly sedated.    01/2017 - diagnosis of autism spectrum disorder by Dr Barb Bonito.  Physical Exam: Ht 4' 10.66" (1.49 m)   Wt 103 lb (46.7 kg)   LMP 06/01/2023   BMI 21.04 kg/m   General: well developed, well nourished young woman, seated on exam table, in no evident distress Head: normocephalic and atraumatic. No dysmorphic features. Neck: supple  Cardiovascular: regular rate and rhythm, no murmurs. Respiratory: clear to auscultation bilaterally Musculoskeletal: no skeletal deformities. Has neuromuscular scoliosis. Has left hemiparesis with hemiatrophy Skin: no rashes or neurocutaneous lesions  Neurologic Exam Mental Status: awake and fully alert. Attention span, concentration and fund of knowledge appropriate for age. Speech fluent without dysarthria. Able to follow commands and participate in examination Cranial Nerves: fundoscopic exam - red reflex present.  Unable to fully visualize fundus.  Pupils equal briskly reactive to light.  Turns to localize faces and  objects in the periphery. Turns to localize sounds in the periphery. Facial movements are symmetric Motor: normal functional bulk, tone and strength on the right. Hemiparesis on the left with the hand fisted. Is able to use the left hand and arm as a helper.  Sensory: withdrawal x 4 Coordination: unable to adequately assess due to patient's inability to participate in examination. No dysmetria with reach for objects on the right. Gait and Station: Mild left hemiparetic gait  Impression: Partial epilepsy with impairment of consciousness (HCC)  Generalized anxiety disorder - Plan: FLUoxetine (PROZAC) 20 MG tablet  Insomnia due to anxiety and fear - Plan: cloNIDine (CATAPRES) 0.1 MG tablet  Congenital hemiplegia (HCC)  Generalized convulsive epilepsy (HCC)  Chronic scapular pain  Neuromuscular scoliosis of lumbar region   Recommendations for plan of care: The patient's previous Epic records were reviewed. No recent diagnostic studies to be reviewed with the patient. I talked with Zuleima about her concerns and recommended increasing the Fluoxetine dose. She has been sensitive to dose changes so I recommended a small increase for now with plans to continue to increase as indicated.   We talked about her problems with early waking and I recommended taking an additional 1/2 tablet of Clonidine if she awakens at early and is unable to return to sleep.  Plan until next visit: Fluoxetine changed to 20mg  tablets Call in 1 month to report. May increase the dose further at that time.  Work on getting established with a new therapist Take additional 1/2 tablet of Clonidine 0.1mg  for early awakening Keep track of auras. If they continue we will check a Carbamazepine level and/or increase the dose Work on stretching and strengthening exercises for back pain Continue other medications as prescribed  Call for questions or concerns Return in about 6 months (around 12/17/2023).  The medication list  was reviewed and reconciled. I reviewed the changes that were made in the prescribed medications today. A complete medication list was provided to the patient.  Allergies as of 06/17/2023       Reactions   Amoxicillin Rash        Medication List        Accurate as of June 17, 2023  6:43 PM. If you have any questions, ask your nurse or doctor.          STOP taking these medications    FLUoxetine 20 MG/5ML solution Commonly known as: PROZAC Replaced by: FLUoxetine 20 MG tablet Stopped by: Elveria Rising       TAKE these medications    carbamazepine 100 MG chewable tablet Commonly known as: TEGRETOL CHEW 2 TABLETS BY MOUTH IN THE MORNING, 2 TABLETS IN THE AFTERNOON, AND 3 TABLETS AT BEDTIME.   cloNIDine 0.1 MG tablet Commonly known as: CATAPRES TAKE 1 AND 1/2 TABLETS BY MOUTH 30 MINUTES BEFORE BEDTIME. MAY REPEAT 1/2 TABLET DURING THE NIGHT IF NEEDED FOR SLEEP What changed: See the new instructions. Changed by: Elveria Rising   famotidine 10  MG tablet Commonly known as: PEPCID Take 1 tablet (10 mg total) by mouth 2 (two) times daily.   FLUoxetine 20 MG tablet Commonly known as: PROZAC Take 1 tablet (20 mg total) by mouth daily. Replaces: FLUoxetine 20 MG/5ML solution Started by: Lyndol Santee   hydrochlorothiazide 12.5 MG tablet Commonly known as: HYDRODIURIL Take 1 tablet by mouth daily.   ibuprofen 200 MG tablet Commonly known as: ADVIL Take 1 tablet at onset of headache, may repeat in 6 hours if needed.   ketorolac 10 MG tablet Commonly known as: TORADOL Take 1 tablet (10 mg total) by mouth every 6 (six) hours as needed.   meclizine 12.5 MG tablet Commonly known as: ANTIVERT Take 1 tablet (12.5 mg total) by mouth 3 (three) times daily as needed for dizziness.   Melatonin 10 MG Tabs Take 1 tablet at bedtime   ondansetron 4 MG disintegrating tablet Commonly known as: ZOFRAN-ODT Take 1 tablet (4 mg total) by mouth every 8 (eight) hours as  needed for nausea or vomiting.   potassium citrate 10 MEQ (1080 MG) SR tablet Commonly known as: UROCIT-K Take by mouth.   traZODone 50 MG tablet Commonly known as: DESYREL TAKE 1 & 1/2 TABLETS BY MOUTH 30 MINS BEFORE BEDTIME.      Total time spent with the patient was 35 minutes, of which 50% or more was spent in counseling and coordination of care.  Lyndol Santee NP-C [facility] [address], [address] [place], [place] [address] Ph. [phone] Fax [phone]

## 2023-06-17 ENCOUNTER — Encounter (INDEPENDENT_AMBULATORY_CARE_PROVIDER_SITE_OTHER): Payer: Self-pay | Admitting: Family

## 2023-06-17 ENCOUNTER — Ambulatory Visit (INDEPENDENT_AMBULATORY_CARE_PROVIDER_SITE_OTHER): Payer: BC Managed Care – PPO | Admitting: Family

## 2023-06-17 ENCOUNTER — Telehealth (INDEPENDENT_AMBULATORY_CARE_PROVIDER_SITE_OTHER): Payer: Self-pay

## 2023-06-17 VITALS — BP 100/74 | HR 100 | Ht 58.66 in | Wt 103.0 lb

## 2023-06-17 DIAGNOSIS — G808 Other cerebral palsy: Secondary | ICD-10-CM | POA: Diagnosis not present

## 2023-06-17 DIAGNOSIS — F5105 Insomnia due to other mental disorder: Secondary | ICD-10-CM | POA: Diagnosis not present

## 2023-06-17 DIAGNOSIS — G40209 Localization-related (focal) (partial) symptomatic epilepsy and epileptic syndromes with complex partial seizures, not intractable, without status epilepticus: Secondary | ICD-10-CM | POA: Diagnosis not present

## 2023-06-17 DIAGNOSIS — M4146 Neuromuscular scoliosis, lumbar region: Secondary | ICD-10-CM

## 2023-06-17 DIAGNOSIS — M898X1 Other specified disorders of bone, shoulder: Secondary | ICD-10-CM

## 2023-06-17 DIAGNOSIS — F409 Phobic anxiety disorder, unspecified: Secondary | ICD-10-CM

## 2023-06-17 DIAGNOSIS — F411 Generalized anxiety disorder: Secondary | ICD-10-CM | POA: Diagnosis not present

## 2023-06-17 DIAGNOSIS — G40309 Generalized idiopathic epilepsy and epileptic syndromes, not intractable, without status epilepticus: Secondary | ICD-10-CM

## 2023-06-17 DIAGNOSIS — G8929 Other chronic pain: Secondary | ICD-10-CM

## 2023-06-17 MED ORDER — CLONIDINE HCL 0.1 MG PO TABS: ORAL_TABLET | ORAL | 3 refills | Status: DC

## 2023-06-17 MED ORDER — FLUOXETINE HCL 20 MG PO TABS: 20.0000 mg | ORAL_TABLET | Freq: Every day | ORAL | 3 refills | Status: DC

## 2023-06-17 NOTE — Patient Instructions (Addendum)
 It was a pleasure to see you today!  Instructions for you until your next appointment are as follows: I changed the Fluoxetine to 20mg  tablets. Take 1 tablet once per day. This is a slight increase from the dose you were taking.  Call me in a month and let me know how you are doing with the new dose If you awaken at 3AM, take 1/2 tablet of Clonidine to see if you can get back to sleep.  Keep track of the aura's so we can decide if we need to make changes in the seizure medicine dose.  Work on stretching and strengthening exercises for your back.  Please sign up for MyChart if you have not done so. Please plan to return for follow up in 6 months or sooner if needed.  Feel free to contact our office during normal business hours at [phone] with questions or concerns. If there is no answer or the call is outside business hours, please leave a message and our clinic staff will call you back within the next business day.  If you have an urgent concern, please stay on the line for our after-hours answering service and ask for the on-call neurologist.     I also encourage you to use MyChart to communicate with me more directly. If you have not yet signed up for MyChart within [facility], the front desk staff can help you. However, please note that this inbox is NOT monitored on nights or weekends, and response can take up to 2 business days.  Urgent matters should be discussed with the on-call pediatric neurologist.   At [facility], we are committed to providing exceptional care. You will receive a patient satisfaction survey through text or email regarding your visit today. Your opinion is important to me. Comments are appreciated.

## 2023-06-17 NOTE — Telephone Encounter (Signed)
 Received a call from [facility] requesting for patients Fluoxetine be filled as capsules .   Provided pharmacy with verbal approval.   SS, CCMA

## 2023-06-23 ENCOUNTER — Telehealth (INDEPENDENT_AMBULATORY_CARE_PROVIDER_SITE_OTHER): Payer: Self-pay | Admitting: Family

## 2023-06-23 DIAGNOSIS — F409 Phobic anxiety disorder, unspecified: Secondary | ICD-10-CM

## 2023-06-23 MED ORDER — TRAZODONE HCL 50 MG PO TABS: ORAL_TABLET | ORAL | 5 refills | Status: DC

## 2023-06-23 NOTE — Telephone Encounter (Signed)
  Name of who is calling: Tristan Furlough Relationship to Patient: Mom  Best contact number: [phone]  Provider they see: Lyndol Santee  Reason for call: Mom is calling to get a refill on a prescription.      PRESCRIPTION REFILL ONLY  Name of prescription:   Pharmacy: [facility] [place].

## 2023-06-23 NOTE — Telephone Encounter (Signed)
 Called mom to inquire about the medication that needed a refill.  Mom stated that the medication is Trazodone .   Refills sent to pharmacy.   SS, CCMA

## 2023-06-24 ENCOUNTER — Other Ambulatory Visit (INDEPENDENT_AMBULATORY_CARE_PROVIDER_SITE_OTHER): Payer: Self-pay

## 2023-06-24 DIAGNOSIS — F409 Phobic anxiety disorder, unspecified: Secondary | ICD-10-CM

## 2023-06-24 MED ORDER — TRAZODONE HCL 50 MG PO TABS: ORAL_TABLET | ORAL | 5 refills | Status: DC

## 2023-07-22 ENCOUNTER — Encounter (INDEPENDENT_AMBULATORY_CARE_PROVIDER_SITE_OTHER): Payer: Self-pay

## 2023-08-11 DIAGNOSIS — F411 Generalized anxiety disorder: Secondary | ICD-10-CM | POA: Diagnosis not present

## 2023-08-17 ENCOUNTER — Telehealth: Payer: Self-pay | Admitting: Internal Medicine

## 2023-08-17 NOTE — Telephone Encounter (Signed)
 Printed immunization refcord from [employer]. Will come by to pick up will also be getting a ppd skin test.

## 2023-08-17 NOTE — Telephone Encounter (Signed)
  Patient needs to know which one she is missing had her cell phone with her with a list. Needs to be done by  07.15.2025. Call patient at [phone] she has questions on other immunization .    Also needs to sign the immunization paper that was dropped off possible may have to update the immunization sheet print it and sign that new sheet.   Copied Noted Original copy in provider box Copy front folder front desk

## 2023-08-17 NOTE — Telephone Encounter (Signed)
 Immunization needs to be signed by provider  Noted [employer] message out to nurse to come speak to patient has a list of immunization to add to list.

## 2023-08-17 NOTE — Telephone Encounter (Signed)
 Pt will come for ppd on 08/26/23. Immunization record at my desk.

## 2023-08-18 DIAGNOSIS — F411 Generalized anxiety disorder: Secondary | ICD-10-CM | POA: Diagnosis not present

## 2023-08-19 ENCOUNTER — Ambulatory Visit

## 2023-08-21 ENCOUNTER — Other Ambulatory Visit (INDEPENDENT_AMBULATORY_CARE_PROVIDER_SITE_OTHER): Payer: Self-pay | Admitting: Family

## 2023-08-21 DIAGNOSIS — F411 Generalized anxiety disorder: Secondary | ICD-10-CM

## 2023-08-25 ENCOUNTER — Ambulatory Visit: Payer: Self-pay | Admitting: Internal Medicine

## 2023-08-25 ENCOUNTER — Encounter: Payer: Self-pay | Admitting: Internal Medicine

## 2023-08-25 VITALS — BP 114/79 | HR 96 | Resp 16 | Ht 59.0 in | Wt 102.8 lb

## 2023-08-25 DIAGNOSIS — K580 Irritable bowel syndrome with diarrhea: Secondary | ICD-10-CM

## 2023-08-25 DIAGNOSIS — F411 Generalized anxiety disorder: Secondary | ICD-10-CM

## 2023-08-25 DIAGNOSIS — K529 Noninfective gastroenteritis and colitis, unspecified: Secondary | ICD-10-CM | POA: Insufficient documentation

## 2023-08-25 DIAGNOSIS — J3089 Other allergic rhinitis: Secondary | ICD-10-CM | POA: Diagnosis not present

## 2023-08-25 DIAGNOSIS — J0111 Acute recurrent frontal sinusitis: Secondary | ICD-10-CM

## 2023-08-25 DIAGNOSIS — R5382 Chronic fatigue, unspecified: Secondary | ICD-10-CM

## 2023-08-25 DIAGNOSIS — E559 Vitamin D deficiency, unspecified: Secondary | ICD-10-CM | POA: Diagnosis not present

## 2023-08-25 DIAGNOSIS — E538 Deficiency of other specified B group vitamins: Secondary | ICD-10-CM | POA: Diagnosis not present

## 2023-08-25 MED ORDER — LEVOCETIRIZINE DIHYDROCHLORIDE 5 MG PO TABS: 5.0000 mg | ORAL_TABLET | Freq: Every evening | ORAL | 3 refills | Status: DC

## 2023-08-25 MED ORDER — AZITHROMYCIN 250 MG PO TABS: ORAL_TABLET | ORAL | 0 refills | Status: AC

## 2023-08-25 MED ORDER — FLUTICASONE PROPIONATE 50 MCG/ACT NA SUSP: 2.0000 | Freq: Every day | NASAL | 1 refills | Status: DC

## 2023-08-25 NOTE — Patient Instructions (Signed)
 Please start taking Azithromycin  and Xyzal as prescribed.  Please use Flonase for nasal congestion/allergies.  Please use sinus inhaler/vaporizer as needed for nasal congestion.  Okay to use Pataday eye drop for allergic symptoms in the eyes.

## 2023-08-25 NOTE — Assessment & Plan Note (Addendum)
 Overall better controlled with Prozac  and trazodone  currently Currently managed by pediatric neurology

## 2023-08-25 NOTE — Assessment & Plan Note (Addendum)
 Has symptoms of IBS, she has uncontrolled anxiety, has chronic abdominal pain and diarrhea Was told to avoid milk products and wheat by GI provider Was taking Bentyl, but was not effective, DCed Bentyl as it was not helpful Used to be followed by GI in [place], but has lost follow-up Offered local GI referral, but she prefers to wait for now

## 2023-08-25 NOTE — Assessment & Plan Note (Signed)
 Could be related to IBS, but previous GI had advised her to avoid milk products and wheat Offered local GI referral, but she prefers to wait for now Advised to keep food diary Maintain adequate hydration

## 2023-08-25 NOTE — Assessment & Plan Note (Signed)
 Started Xyzal Flonase for nasal congestion

## 2023-08-25 NOTE — Assessment & Plan Note (Signed)
 Likely multifactorial, due to GAD, nutritional deficiencies and chronic diarrhea Check CBC, CMP, TSH + free T4, Vit D, Vit B12 Advised to maintain adequate hydration and eat at regular intervals

## 2023-08-25 NOTE — Progress Notes (Signed)
 Acute Office Visit  Subjective:    Patient ID: Bonnie Simon, female    DOB: [birth year], 23 y.o.   MRN: [id number]  Chief Complaint  Patient presents with   Allergies    Runny nose, red eyes, thick mucus in throat x a few weeks now. Has tried advil cold and sinus and sinus spray but doesn't seem to be helping     HPI Patient is in today for complaint of nasal congestion, sinus pressure related headache, bilateral ear fullness and sore throat for the last few weeks.  Denies any fever, chills, dyspnea or wheezing currently.  She has cough with thick mucus like feeling in the throat.  She has tried Advil Cold and Sinus and OTC nasal decongestant spray without much relief.  She also reports redness of the eyes on recurrent basis.  Denies visual disturbance currently.  She reports chronic fatigue, and attributes it to her being stressed/anxious.  She has chronic diarrhea, but denies any melena or hematochezia.  She has been evaluated by GI in the past, was told to avoid milk products and wheat.  Chart review suggests that she did not have celiac disease on GI evaluation.  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 Jerolyn at [facility]    Family History  Problem Relation Age of Onset   Heart Problems Paternal Grandfather        Died at 40    Social History   Socioeconomic History   Marital status: Single    Spouse name: Not on file   Number of children: Not on file   Years of education: Not on file   Highest education level: Not on file  Occupational History   Not on file  Tobacco Use   Smoking status: Never    Passive exposure: Yes   Smokeless  tobacco: Never  Substance and Sexual Activity   Alcohol use: No   Drug use: No   Sexual activity: Never  Other Topics Concern   Not on file  Social History Narrative   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   She attends [employer]. Transferring to [employer] - Psychology.    Rickie enjoys playing marching band, basketball, soccer, and watching TV.   Saniyyah lives with her parents. She has an adult sister that does not live at home.   Social Drivers of Corporate investment banker Strain: Not on file  Food Insecurity: Not on file  Transportation Needs: Not on file  Physical Activity: Not on file  Stress: Not on file  Social Connections: Unknown (02/18/2022)   [facility]   Social Network    Social Network: Not on file  Intimate Partner Violence: Unknown (02/18/2022)   Received from [facility]   HITS    Physically Hurt: Not on file    Insult or Talk Down To: Not on file    Threaten Physical Harm: Not on file    Scream or Curse: Not on file    Outpatient Medications Prior to Visit  Medication Sig Dispense Refill   carbamazepine  (TEGRETOL ) 100  MG chewable tablet CHEW 2 TABLETS BY MOUTH IN THE MORNING, 2 TABLETS IN THE AFTERNOON, AND 3 TABLETS AT BEDTIME. 210 tablet 5   cloNIDine  (CATAPRES ) 0.1 MG tablet TAKE 1 AND 1/2 TABLETS BY MOUTH 30 MINUTES BEFORE BEDTIME. MAY REPEAT 1/2 TABLET DURING THE NIGHT IF NEEDED FOR SLEEP 60 tablet 3   famotidine  (PEPCID ) 10 MG tablet Take 1 tablet (10 mg total) by mouth 2 (two) times daily. 60 tablet 2   FLUoxetine  (PROZAC ) 20 MG tablet Take 1 tablet (20 mg total) by mouth daily. 30 tablet 3   hydrochlorothiazide (HYDRODIURIL) 12.5 MG tablet Take 1 tablet by mouth daily.     Melatonin 10 MG TABS Take 1 tablet at bedtime     potassium citrate (UROCIT-K) 10 MEQ (1080 MG) SR tablet Take by mouth.     traZODone  (DESYREL ) 50 MG tablet TAKE 1 & 1/2 TABLETS BY MOUTH 30 MINS BEFORE BEDTIME. 45 tablet 5   ibuprofen  (ADVIL) 200 MG tablet Take 1 tablet at onset of headache, may repeat in 6 hours if needed. 30 tablet 0   ketorolac  (TORADOL ) 10 MG tablet Take 1 tablet (10 mg total) by mouth every 6 (six) hours as needed. (Patient not taking: Reported on 06/17/2023) 20 tablet 0   meclizine  (ANTIVERT ) 12.5 MG tablet Take 1 tablet (12.5 mg total) by mouth 3 (three) times daily as needed for dizziness. (Patient not taking: Reported on 06/17/2023) 30 tablet 0   ondansetron  (ZOFRAN -ODT) 4 MG disintegrating tablet Take 1 tablet (4 mg total) by mouth every 8 (eight) hours as needed for nausea or vomiting. (Patient not taking: Reported on 06/17/2023) 15 tablet 0   No facility-administered medications prior to visit.    Allergies  Allergen Reactions   Amoxicillin Rash    Review of Systems  Constitutional:  Negative for chills and fever.  HENT:  Positive for congestion, sinus pressure and sore throat.   Eyes:  Positive for discharge, redness and itching. Negative for pain.  Respiratory:  Positive for cough. Negative for shortness of breath.   Cardiovascular:  Negative for chest pain and palpitations.  Gastrointestinal:  Negative for diarrhea, nausea and vomiting.  Endocrine: Negative for polydipsia and polyuria.  Genitourinary:  Negative for dysuria and hematuria.  Musculoskeletal:  Positive for back pain and myalgias. Negative for neck pain and neck stiffness.  Skin:  Negative for rash.  Neurological:  Negative for dizziness and weakness.  Psychiatric/Behavioral:  Positive for sleep disturbance. Negative for agitation and behavioral problems. The patient is nervous/anxious.        Objective:    Physical Exam Vitals reviewed.  Constitutional:      General: She is not in acute distress.    Appearance: She is not diaphoretic.  HENT:     Head: Normocephalic and atraumatic.     Nose: Congestion present.     Right Sinus: Frontal sinus tenderness present.     Left Sinus: Frontal sinus tenderness present.      Mouth/Throat:     Mouth: Mucous membranes are moist.     Pharynx: Posterior oropharyngeal erythema present.   Eyes:     General: No scleral icterus.    Extraocular Movements: Extraocular movements intact.    Cardiovascular:     Rate and Rhythm: Normal rate and regular rhythm.     Heart sounds: Normal heart sounds. No murmur heard. Pulmonary:     Breath sounds: Normal breath sounds. No wheezing or rales.  Abdominal:     Palpations: Abdomen  is soft.     Tenderness: There is no abdominal tenderness.   Musculoskeletal:     Cervical back: Neck supple. No tenderness.     Thoracic back: Tenderness (Right paraspinal/scapular border) present.     Lumbar back: Tenderness (Lower lumbar) present. Normal range of motion. Negative right straight leg raise test and negative left straight leg raise test.     Right lower leg: No edema.     Left lower leg: No edema.   Skin:    General: Skin is warm.     Findings: No rash.   Neurological:     General: No focal deficit present.     Mental Status: She is alert and oriented to person, place, and time.   Psychiatric:        Mood and Affect: Mood normal.        Behavior: Behavior normal.     BP 114/79   Pulse 96   Resp 16   Ht 4' 11 (1.499 m)   Wt 102 lb 12.8 oz (46.6 kg)   SpO2 100%   BMI 20.76 kg/m  Wt Readings from Last 3 Encounters:  08/25/23 102 lb 12.8 oz (46.6 kg)  06/17/23 103 lb (46.7 kg)  05/07/23 103 lb 6.4 oz (46.9 kg)        Assessment & Plan:   Problem List Items Addressed This Visit       Respiratory   Acute recurrent frontal sinusitis - Primary   Started empiric azithromycin  considering persistent symptoms despite symptomatic treatment Flonase for nasal congestion Advised to use sinus inhaler/vaporizer as needed for nasal congestion      Relevant Medications   azithromycin  (ZITHROMAX ) 250 MG tablet   levocetirizine (XYZAL) 5 MG tablet   fluticasone (FLONASE) 50 MCG/ACT nasal spray     Digestive    Irritable bowel syndrome   Has symptoms of IBS, she has uncontrolled anxiety, has chronic abdominal pain and diarrhea Was told to avoid milk products and wheat by GI provider Was taking Bentyl, but was not effective, DCed Bentyl as it was not helpful Used to be followed by GI in [place], but has lost follow-up Offered local GI referral, but she prefers to wait for now      Chronic diarrhea   Could be related to IBS, but previous GI had advised her to avoid milk products and wheat Offered local GI referral, but she prefers to wait for now Advised to keep food diary Maintain adequate hydration         Other   Generalized anxiety disorder   Overall better controlled with Prozac  and trazodone  currently Currently managed by pediatric neurology      Chronic fatigue   Likely multifactorial, due to GAD, nutritional deficiencies and chronic diarrhea Check CBC, CMP, TSH + free T4, Vit D, Vit B12 Advised to maintain adequate hydration and eat at regular intervals      Relevant Orders   CBC with Differential/Platelet   Fe+TIBC+Fer   CMP14+EGFR   TSH + free T4   Environmental and seasonal allergies   Started Xyzal Flonase for nasal congestion      Relevant Medications   levocetirizine (XYZAL) 5 MG tablet   fluticasone (FLONASE) 50 MCG/ACT nasal spray   Other Visit Diagnoses       Vitamin D deficiency       Relevant Orders   Vitamin D (25 hydroxy)     Vitamin B12 deficiency       Relevant Orders  B12        Meds ordered this encounter  Medications   azithromycin  (ZITHROMAX ) 250 MG tablet    Sig: Take 2 tablets on day 1, then 1 tablet daily on days 2 through 5    Dispense:  6 tablet    Refill:  0   levocetirizine (XYZAL) 5 MG tablet    Sig: Take 1 tablet (5 mg total) by mouth every evening.    Dispense:  30 tablet    Refill:  3   fluticasone (FLONASE) 50 MCG/ACT nasal spray    Sig: Place 2 sprays into both nostrils daily.    Dispense:  16 g    Refill:  1      Cherika Jessie MARLA Blanch, MD

## 2023-08-25 NOTE — Assessment & Plan Note (Signed)
 Started empiric azithromycin  considering persistent symptoms despite symptomatic treatment Flonase for nasal congestion Advised to use sinus inhaler/vaporizer as needed for nasal congestion

## 2023-08-26 ENCOUNTER — Ambulatory Visit (INDEPENDENT_AMBULATORY_CARE_PROVIDER_SITE_OTHER)

## 2023-08-26 ENCOUNTER — Ambulatory Visit: Payer: Self-pay | Admitting: Internal Medicine

## 2023-08-26 DIAGNOSIS — Z111 Encounter for screening for respiratory tuberculosis: Secondary | ICD-10-CM | POA: Diagnosis not present

## 2023-08-26 LAB — CBC WITH DIFFERENTIAL/PLATELET
Basophils Absolute: 0 10*3/uL (ref 0.0–0.2)
Basos: 1 %
EOS (ABSOLUTE): 0 10*3/uL (ref 0.0–0.4)
Eos: 1 %
Hematocrit: 43.3 % (ref 34.0–46.6)
Hemoglobin: 14.6 g/dL (ref 11.1–15.9)
Immature Grans (Abs): 0 10*3/uL (ref 0.0–0.1)
Immature Granulocytes: 0 %
Lymphocytes Absolute: 1.1 10*3/uL (ref 0.7–3.1)
Lymphs: 32 %
MCH: 32.4 pg (ref 26.6–33.0)
MCHC: 33.7 g/dL (ref 31.5–35.7)
MCV: 96 fL (ref 79–97)
Monocytes Absolute: 0.4 10*3/uL (ref 0.1–0.9)
Monocytes: 11 %
Neutrophils Absolute: 2 10*3/uL (ref 1.4–7.0)
Neutrophils: 55 %
Platelets: 247 10*3/uL (ref 150–450)
RBC: 4.51 x10E6/uL (ref 3.77–5.28)
RDW: 11.9 % (ref 11.7–15.4)
WBC: 3.6 10*3/uL (ref 3.4–10.8)

## 2023-08-26 LAB — IRON,TIBC AND FERRITIN PANEL
Ferritin: 27 ng/mL (ref 15–150)
Iron Saturation: 71 % — ABNORMAL HIGH (ref 15–55)
Iron: 212 ug/dL — ABNORMAL HIGH (ref 27–159)
Total Iron Binding Capacity: 297 ug/dL (ref 250–450)
UIBC: 85 ug/dL — ABNORMAL LOW (ref 131–425)

## 2023-08-26 LAB — CMP14+EGFR
ALT: 18 IU/L (ref 0–32)
AST: 17 IU/L (ref 0–40)
Albumin: 4.9 g/dL (ref 4.0–5.0)
Alkaline Phosphatase: 81 IU/L (ref 42–106)
BUN/Creatinine Ratio: 16 (ref 9–23)
BUN: 12 mg/dL (ref 6–20)
Bilirubin Total: 0.3 mg/dL (ref 0.0–1.2)
CO2: 22 mmol/L (ref 20–29)
Calcium: 10 mg/dL (ref 8.7–10.2)
Chloride: 98 mmol/L (ref 96–106)
Creatinine, Ser: 0.77 mg/dL (ref 0.57–1.00)
Globulin, Total: 2.5 g/dL (ref 1.5–4.5)
Glucose: 79 mg/dL (ref 70–99)
Potassium: 4.1 mmol/L (ref 3.5–5.2)
Sodium: 139 mmol/L (ref 134–144)
Total Protein: 7.4 g/dL (ref 6.0–8.5)
eGFR: 113 mL/min/{1.73_m2} (ref >59)

## 2023-08-26 LAB — TSH+FREE T4
Free T4: 0.97 ng/dL (ref 0.82–1.77)
TSH: 2.52 u[IU]/mL (ref 0.450–4.500)

## 2023-08-26 LAB — VITAMIN D 25 HYDROXY (VIT D DEFICIENCY, FRACTURES): Vit D, 25-Hydroxy: 32.5 ng/mL (ref 30.0–100.0)

## 2023-08-26 LAB — VITAMIN B12: Vitamin B-12: 287 pg/mL (ref 232–1245)

## 2023-08-26 NOTE — Progress Notes (Signed)
 Patient is in office today for a nurse visit for PPD. Patient Injection was given in the  Right arm. Patient tolerated injection well.

## 2023-08-28 ENCOUNTER — Telehealth: Payer: Self-pay | Admitting: Internal Medicine

## 2023-08-28 ENCOUNTER — Ambulatory Visit

## 2023-08-28 LAB — TB SKIN TEST
Induration: 0 mm
TB Skin Test: NEGATIVE

## 2023-08-28 NOTE — Telephone Encounter (Signed)
 Copied from CRM [id number]. Topic: General - Other >> Aug 28, 2023 11:05 AM Turkey B wrote: Reason for CRM: pt called in , says is coming to drop off paperwork today to to have her immunization info filled out on it. Pt states she needs this done by July 15

## 2023-08-31 ENCOUNTER — Telehealth: Payer: Self-pay | Admitting: Internal Medicine

## 2023-08-31 NOTE — Telephone Encounter (Signed)
 TB Reading Form- found on line needs the information that was given to her last week on this particular form.  Noted Copied Sleeved Original placed in provider box Copy placed front desk in folder Call patient at [phone] when form is ready for pick up.

## 2023-09-02 DIAGNOSIS — F411 Generalized anxiety disorder: Secondary | ICD-10-CM | POA: Diagnosis not present

## 2023-09-08 DIAGNOSIS — F411 Generalized anxiety disorder: Secondary | ICD-10-CM | POA: Diagnosis not present

## 2023-09-14 ENCOUNTER — Ambulatory Visit

## 2023-09-14 ENCOUNTER — Ambulatory Visit: Payer: Self-pay

## 2023-09-14 VITALS — BP 109/70 | HR 77 | Ht <= 58 in | Wt 103.0 lb

## 2023-09-14 DIAGNOSIS — J0111 Acute recurrent frontal sinusitis: Secondary | ICD-10-CM | POA: Diagnosis not present

## 2023-09-14 MED ORDER — SULFAMETHOXAZOLE-TRIMETHOPRIM 800-160 MG PO TABS: 1.0000 | ORAL_TABLET | Freq: Two times a day (BID) | ORAL | 0 refills | Status: AC

## 2023-09-14 NOTE — Telephone Encounter (Signed)
     FYI Only or Action Required?: Action required by provider: request for appointment.  Patient was last seen in primary care on 08/25/2023 by Tobie Suzzane POUR, MD.  Called Nurse Triage reporting Sinusitis.  Symptoms began several days ago.  Interventions attempted: Prescription medications:  SABRA  Symptoms are: gradually worsening. Seen in office and treated for sinus infection, no better.   Triage Disposition: No disposition on file.  Patient/caregiver understands and will follow disposition?: Answer Assessment - Initial Assessment Questions 1. ANTIBIOTIC: What antibiotic are you taking? How many times a day?     Z pack 2. ONSET: When was the antibiotic started?     2 weeks ago 3. PAIN: How bad is the pain?   (Scale 0-10; or none, mild, moderate or severe)     5 4. FEVER: Do you have a fever? If Yes, ask: What is it, how was it measured, and when did it start?      no 5. SYMPTOMS: Are there any other symptoms you're concerned about? If Yes, ask: When did it start?     Fatigue 6. PREGNANCY: Is there any chance you are pregnant? When was your last menstrual period?     no  Protocols used: Sinus Infection on Antibiotic Follow-up Call-[facility]

## 2023-09-14 NOTE — Progress Notes (Signed)
 Established Patient Office Visit  Subjective   Patient ID: Bonnie Simon, female    DOB: Feb 21, 2001  Age: 23 y.o. MRN: [id number]  Chief Complaint  Patient presents with   Medical Management of Chronic Issues    Continued Sinus issues even after completion of antibiotic    HPI Patient was seen on 08/25/2023 by Dr. Tobie and was treated with Z-Pak and Flonase  for recurrent sinus infection.  She presents today with continued sinus issues.  Patient Active Problem List   Diagnosis Date Noted   Chronic diarrhea 08/25/2023   Chronic fatigue 08/25/2023   Environmental and seasonal allergies 08/25/2023   Acute recurrent frontal sinusitis 08/25/2023   Encounter for general adult medical examination with abnormal findings 05/07/2023   Chronic scapular pain 05/07/2023   Scoliosis of lumbar spine 05/07/2023   Acute bacterial bronchitis 03/09/2023   Post-traumatic headache, not intractable 11/17/2022   Closed head injury 11/16/2022   Vertigo 11/12/2022   Nephrolithiasis 07/07/2022   Gastroesophageal reflux disease without esophagitis 07/07/2022   Irritable bowel syndrome 07/07/2022   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   Autism spectrum disorder 02/20/2017   Generalized anxiety disorder 07/05/2014   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  Constitutional:      Appearance: Normal appearance.  HENT:     Head: Normocephalic.     Right Ear: Tympanic membrane, ear canal and external ear normal.     Left Ear: Tympanic membrane, ear canal and external ear normal.     Nose: No congestion or rhinorrhea.     Right Turbinates: Swollen.     Right Sinus: Frontal sinus tenderness present. No maxillary sinus tenderness.     Left Sinus: Frontal sinus tenderness present. No maxillary sinus tenderness.  Eyes:     Extraocular Movements: Extraocular movements intact.     Pupils: Pupils are equal, round, and reactive to light.  Cardiovascular:     Rate and Rhythm: Normal rate and regular rhythm.  Pulmonary:     Effort: Pulmonary effort is normal.     Breath sounds: Normal breath sounds.  Musculoskeletal:     Cervical back: Normal range of motion and neck supple.  Neurological:     Mental Status: She is alert and oriented to person, place, and time.  Psychiatric:        Mood and Affect: Mood normal.        Thought Content: Thought content normal.    No results found for any visits on 09/14/23.    The ASCVD Risk score (Arnett DK, et al., 2019) failed to calculate for the following reasons:   The 2019 ASCVD risk score is only valid for ages 29 to 34    Assessment &  Plan:   Problem List Items Addressed This Visit       Respiratory   Acute recurrent frontal sinusitis - Primary   Treat with oral Bactrim . Continue with Xyzal  and flonase . Recommend adding OTC Sudafed for sinus pressure.  Recommend follow-up if no improvement in 5-7 days or sooner if symptoms worsen.       Relevant Medications   sulfamethoxazole -trimethoprim  (BACTRIM  DS) 800-160 MG tablet    No follow-ups on file.    Leita Longs, FNP

## 2023-09-14 NOTE — Assessment & Plan Note (Signed)
 Treat with oral Bactrim . Continue with Xyzal  and flonase . Recommend adding OTC Sudafed for sinus pressure.  Recommend follow-up if no improvement in 5-7 days or sooner if symptoms worsen.

## 2023-09-14 NOTE — Telephone Encounter (Signed)
 Copied from CRM [id number]. Topic: General - Other >> Sep 14, 2023  8:05 AM Cleave MATSU wrote: Reason for CRM: pt stated that Dr.Patel gave her a z pac and steroid for allergies but she still isnt feeling any better. Please follow up with patient

## 2023-09-16 ENCOUNTER — Ambulatory Visit: Payer: Self-pay

## 2023-09-16 DIAGNOSIS — F411 Generalized anxiety disorder: Secondary | ICD-10-CM | POA: Diagnosis not present

## 2023-09-22 DIAGNOSIS — F411 Generalized anxiety disorder: Secondary | ICD-10-CM | POA: Diagnosis not present

## 2023-09-29 DIAGNOSIS — H10012 Acute follicular conjunctivitis, left eye: Secondary | ICD-10-CM | POA: Diagnosis not present

## 2023-09-30 DIAGNOSIS — F411 Generalized anxiety disorder: Secondary | ICD-10-CM | POA: Diagnosis not present

## 2023-10-01 ENCOUNTER — Other Ambulatory Visit (INDEPENDENT_AMBULATORY_CARE_PROVIDER_SITE_OTHER): Payer: Self-pay

## 2023-10-01 DIAGNOSIS — G40309 Generalized idiopathic epilepsy and epileptic syndromes, not intractable, without status epilepticus: Secondary | ICD-10-CM

## 2023-10-01 DIAGNOSIS — G40209 Localization-related (focal) (partial) symptomatic epilepsy and epileptic syndromes with complex partial seizures, not intractable, without status epilepticus: Secondary | ICD-10-CM

## 2023-10-01 MED ORDER — CARBAMAZEPINE 100 MG PO CHEW: CHEWABLE_TABLET | ORAL | 2 refills | Status: DC

## 2023-10-11 ENCOUNTER — Other Ambulatory Visit (INDEPENDENT_AMBULATORY_CARE_PROVIDER_SITE_OTHER): Payer: Self-pay | Admitting: Family

## 2023-10-11 DIAGNOSIS — F411 Generalized anxiety disorder: Secondary | ICD-10-CM

## 2023-10-12 ENCOUNTER — Ambulatory Visit

## 2023-10-12 VITALS — BP 117/79 | HR 75 | Ht <= 58 in | Wt 104.0 lb

## 2023-10-12 DIAGNOSIS — J3089 Other allergic rhinitis: Secondary | ICD-10-CM

## 2023-10-12 MED ORDER — METHYLPREDNISOLONE 4 MG PO TBPK: ORAL_TABLET | ORAL | 0 refills | Status: DC

## 2023-10-12 MED ORDER — AZELASTINE HCL 0.1 % NA SOLN
1.0000 | Freq: Two times a day (BID) | NASAL | 12 refills | Status: DC
Start: 2023-10-12 — End: 2023-12-24

## 2023-10-12 NOTE — Progress Notes (Signed)
 Established Patient Office Visit  Subjective   Patient ID: Bonnie Simon, female    DOB: Aug 07, 2000  Age: 23 y.o. MRN: [id number]  Chief Complaint  Patient presents with   Allergic Rhinitis     Runny nose, cough, drainage     HPI  Patient Active Problem List   Diagnosis Date Noted   Chronic diarrhea 08/25/2023   Chronic fatigue 08/25/2023   Environmental and seasonal allergies 08/25/2023   Acute recurrent frontal sinusitis 08/25/2023   Encounter for general adult medical examination with abnormal findings 05/07/2023   Chronic scapular pain 05/07/2023   Scoliosis of lumbar spine 05/07/2023   Acute bacterial bronchitis 03/09/2023   Post-traumatic headache, not intractable 11/17/2022   Closed head injury 11/16/2022   Vertigo 11/12/2022   Nephrolithiasis 07/07/2022   Gastroesophageal reflux disease without esophagitis 07/07/2022   Irritable bowel syndrome 07/07/2022   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   Autism spectrum disorder 02/20/2017   Generalized anxiety disorder 07/05/2014   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  Constitutional:      Appearance: Normal appearance.  HENT:     Head: Normocephalic.     Right Ear: Tympanic membrane, ear canal and external ear normal. There is no impacted cerumen.     Left Ear:  Tympanic membrane, ear canal and external ear normal. There is no impacted cerumen.     Nose: Nose normal.     Right Turbinates: Swollen. Not enlarged.     Left Turbinates: Swollen. Not enlarged.     Right Sinus: No maxillary sinus tenderness or frontal sinus tenderness.     Left Sinus: No maxillary sinus tenderness or frontal sinus tenderness.     Mouth/Throat:     Mouth: Mucous membranes are moist.     Pharynx: Oropharynx is clear. Uvula midline.  Eyes:     Extraocular Movements: Extraocular movements intact.     Pupils: Pupils are equal, round, and reactive to light.  Cardiovascular:     Rate and Rhythm: Normal rate and regular rhythm.  Pulmonary:     Effort: Pulmonary effort is normal.     Breath sounds: Normal breath sounds.  Musculoskeletal:     Cervical back: Normal range of motion and neck supple.  Neurological:     Mental Status: She is alert and oriented to person, place, and time.  Psychiatric:        Mood and Affect: Mood normal.        Thought Content: Thought content normal.      No results found for any visits on 10/12/23.    The ASCVD Risk score (Arnett DK, et al., 2019) failed to calculate for the following reasons:   The 2019 ASCVD risk score is only valid  for ages 61 to 98    Assessment & Plan:   Problem List Items Addressed This Visit       Other   Environmental and seasonal allergies - Primary   Relevant Medications   methylPREDNISolone  (MEDROL  DOSEPAK) 4 MG TBPK tablet   azelastine  (ASTELIN ) 0.1 % nasal spray   Other Relevant Orders   Ambulatory referral to Allergy    No follow-ups on file.    Leita Longs, FNP

## 2023-10-13 DIAGNOSIS — F411 Generalized anxiety disorder: Secondary | ICD-10-CM | POA: Diagnosis not present

## 2023-10-21 DIAGNOSIS — F411 Generalized anxiety disorder: Secondary | ICD-10-CM | POA: Diagnosis not present

## 2023-10-28 DIAGNOSIS — F411 Generalized anxiety disorder: Secondary | ICD-10-CM | POA: Diagnosis not present

## 2023-11-04 DIAGNOSIS — F411 Generalized anxiety disorder: Secondary | ICD-10-CM | POA: Diagnosis not present

## 2023-11-11 DIAGNOSIS — F411 Generalized anxiety disorder: Secondary | ICD-10-CM | POA: Diagnosis not present

## 2023-11-18 DIAGNOSIS — F411 Generalized anxiety disorder: Secondary | ICD-10-CM | POA: Diagnosis not present

## 2023-11-26 ENCOUNTER — Other Ambulatory Visit (INDEPENDENT_AMBULATORY_CARE_PROVIDER_SITE_OTHER): Payer: Self-pay | Admitting: Family

## 2023-11-26 DIAGNOSIS — F409 Phobic anxiety disorder, unspecified: Secondary | ICD-10-CM

## 2023-12-16 DIAGNOSIS — F411 Generalized anxiety disorder: Secondary | ICD-10-CM | POA: Diagnosis not present

## 2023-12-18 ENCOUNTER — Encounter: Payer: Self-pay | Admitting: Allergy & Immunology

## 2023-12-18 ENCOUNTER — Ambulatory Visit: Admitting: Allergy & Immunology

## 2023-12-18 VITALS — BP 104/72 | HR 81 | Temp 98.3°F | Ht <= 58 in | Wt 106.6 lb

## 2023-12-18 DIAGNOSIS — B999 Unspecified infectious disease: Secondary | ICD-10-CM | POA: Diagnosis not present

## 2023-12-18 DIAGNOSIS — Z880 Allergy status to penicillin: Secondary | ICD-10-CM

## 2023-12-18 DIAGNOSIS — J310 Chronic rhinitis: Secondary | ICD-10-CM

## 2023-12-18 DIAGNOSIS — J45990 Exercise induced bronchospasm: Secondary | ICD-10-CM

## 2023-12-18 NOTE — Patient Instructions (Addendum)
 1. Chronic rhinitis - Because of insurance stipulations, we cannot do skin testing on the same day as your first visit. - We are all working to fight this, but for now we need to do two separate visits.  - We will know more after we do testing at the next visit.  - The skin testing visit can be squeezed in at your convenience.  - Then we can make a more full plan to address all of your symptoms. - Be sure to stop your antihistamines for 3 days before this appointment.  - Start Xhance  one spray per nostril daily to see if this helps more.   2. Recurrent sinusitis - We are going to defer on an immune workup at this point in time. - We will see what the allergy testing shows.  3. Exercise induced asthma - Lung testing not done.  - We will not worry about an inhaler at this point.   4. Return in about 1 week (around 12/25/2023) for SKIN TESTING (1-55). You can have the follow up appointment with Dr. Iva or a Nurse Practicioner (our Nurse Practitioners are excellent and always have Physician oversight!).    Please inform us  of any Emergency Department visits, hospitalizations, or changes in symptoms. Call us  before going to the ED for breathing or allergy symptoms since we might be able to fit you in for a sick visit. Feel free to contact us  anytime with any questions, problems, or concerns.  It was a pleasure to meet you and your family today!  Websites that have reliable patient information: 1. American Academy of Asthma, Allergy, and Immunology: www.aaaai.org 2. Food Allergy Research and Education (FARE): foodallergy.org 3. Mothers of Asthmatics: http://www.asthmacommunitynetwork.org 4. American College of Allergy, Asthma, and Immunology: www.acaai.org      "Like" us  on Facebook and Instagram for our latest updates!      A healthy democracy works best when [employer] participate! Make sure you are registered to vote! If you have moved or changed any of your contact  information, you will need to get this updated before voting! Scan the QR codes below to learn more!

## 2023-12-18 NOTE — Progress Notes (Unsigned)
 NEW PATIENT  Date of Service/Encounter:  12/22/23  Consult requested by: Tobie Suzzane POUR, MD   Assessment:   Chronic rhinitis - planning for skin testing at the next visit  Penicillin allergy - last rxn in the 5th grade (consider challenge in the future)  Recurrent sinusitis - deferring on immune workup  Food intolerance (gluten, lactose)   Plan/Recommendations:   1. Chronic rhinitis - Because of insurance stipulations, we cannot do skin testing on the same day as your first visit. - We are all working to fight this, but for now we need to do two separate visits.  - We will know more after we do testing at the next visit.  - The skin testing visit can be squeezed in at your convenience.  - Then we can make a more full plan to address all of your symptoms. - Be sure to stop your antihistamines for 3 days before this appointment.  - Start Xhance  one spray per nostril daily to see if this helps more.   2. Recurrent sinusitis - We are going to defer on an immune workup at this point in time. - We will see what the allergy testing shows.  3. Exercise induced asthma - Lung testing not done.  - We will not worry about an inhaler at this point.   4. Return in about 1 week (around 12/25/2023) for SKIN TESTING (1-55). You can have the follow up appointment with Dr. Iva or a Nurse Practicioner (our Nurse Practitioners are excellent and always have Physician oversight!).   This note in its entirety was forwarded to the Provider who requested this consultation.  Subjective:   Bonnie Simon is a 23 y.o. female presenting today for evaluation of  Chief Complaint  Patient presents with   Establish Care    Pt is here today to establish allergy care/ management. She has seen PCP 3 times 08/2023. She was given antibiotics at her first visit but her symptoms have been ongoing since and are not controlled. She is sneezing, has a runny nose, mild intermittent cough, and she  says her eyes are the worst, they hurt.     Bonnie Simon has a history of the following: Patient Active Problem List   Diagnosis Date Noted   Chronic diarrhea 08/25/2023   Chronic fatigue 08/25/2023   Environmental and seasonal allergies 08/25/2023   Acute recurrent frontal sinusitis 08/25/2023   Encounter for general adult medical examination with abnormal findings 05/07/2023   Chronic scapular pain 05/07/2023   Scoliosis of lumbar spine 05/07/2023   Acute bacterial bronchitis 03/09/2023   Post-traumatic headache, not intractable 11/17/2022   Closed head injury 11/16/2022   Vertigo 11/12/2022   Nephrolithiasis 07/07/2022   Gastroesophageal reflux disease without esophagitis 07/07/2022   Irritable bowel syndrome 07/07/2022   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  Discussed the use of AI scribe software for clinical note transcription with the patient and/or guardian, who gave verbal consent to proceed.  Renard Cathlean Molt was referred by Tobie Suzzane POUR, MD.     Bonnie Simon is a 23 y.o. female presenting for an evaluation of environmental allergies  and chronic sinusitis.  Asthma/Respiratory Symptom History: She has a history of exercise-induced asthma, for which she was previously prescribed an inhaler, but she does not currently use it as she does not engage in physical activities that trigger symptoms.  Allergic Rhinitis Symptom History: She has been experiencing persistent allergy symptoms for the past three to four months, including sinus congestion and eye  irritation. Despite multiple visits to her primary care doctor and treatments with Bactrim , steroids, and nasal spray, she has not found significant relief. She consulted an eye doctor due to severe eye discomfort, and the eye doctor told her she had allergies in her eyes. She was prescribed Allegra and Attaway, but these medications have not been effective. 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 Her symptoms have not been linked to any new environmental changes, such as a new pet, as she has had a cat for some time, which is now mostly outside. Allergy eye drops have not improved her condition, and her eyelids continue to hurt, affecting her ability to see clearly at school.  She is allergic to penicillin, which causes a rash, and avoids grapefruit due to interactions with her seizure medication. She follows a gluten-free diet due to gastrointestinal discomfort, although she has not been formally diagnosed with celiac disease.  She was born prematurely at 10 weeks and has a history of seizures since the age of [age], with the last seizure occurring many years ago. She continues to take seizure medication due to persistent EEG activity.   Otherwise, there is no history of other atopic diseases, including drug allergies, stinging insect allergies, or contact dermatitis. There is no significant infectious history. Vaccinations are up to date.    Past Medical History: Patient Active Problem List   Diagnosis Date Noted   Chronic diarrhea 08/25/2023   Chronic fatigue 08/25/2023   Environmental and seasonal allergies 08/25/2023   Acute recurrent frontal sinusitis 08/25/2023   Encounter for general adult medical examination with abnormal findings 05/07/2023   Chronic scapular pain 05/07/2023   Scoliosis of lumbar spine 05/07/2023   Acute bacterial bronchitis 03/09/2023   Post-traumatic headache, not intractable 11/17/2022    Closed head injury 11/16/2022   Vertigo 11/12/2022   Nephrolithiasis 07/07/2022   Gastroesophageal reflux disease without esophagitis 07/07/2022   Irritable bowel syndrome 07/07/2022   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 If you have any questions, ask your nurse or doctor.          azelastine  0.1 % nasal spray Commonly known as: ASTELIN  Place 1 spray into both nostrils 2 (two) times daily. Use in each nostril as directed   carbamazepine  100 MG chewable tablet Commonly known as: TEGRETOL  CHEW 2 TABLETS BY MOUTH IN THE MORNING, 2 TABLETS IN THE AFTERNOON, AND 3 TABLETS AT BEDTIME.   cloNIDine  0.1 MG tablet Commonly known as: CATAPRES  TAKE  1 AND 1/2 TABLETS BY MOUTH 30 MINUTES BEFORE BEDTIME. MAY REPEAT 1/2 TABLET DURING THE NIGHT IF NEEDED FOR SLEEP   cyanocobalamin  500 MCG tablet Commonly known as: VITAMIN B12 Take 500 mcg by mouth daily.   famotidine  10 MG tablet Commonly known as: PEPCID  Take 1 tablet (10 mg total) by mouth 2 (two) times daily.   fexofenadine 180 MG tablet Commonly known as: ALLEGRA Take 180 mg by mouth daily.   FLUoxetine  20 MG capsule Commonly known as: PROZAC  TAKE ONE CAPSULE BY MOUTH EVERY DAY   fluticasone  50 MCG/ACT nasal spray Commonly known as: FLONASE  Place 2 sprays into both nostrils daily.   hydrochlorothiazide 12.5 MG tablet Commonly known as: HYDRODIURIL Take 1 tablet by mouth daily.    ibuprofen 200 MG tablet Commonly known as: ADVIL Take 1 tablet at onset of headache, may repeat in 6 hours if needed.   levocetirizine 5 MG tablet Commonly known as: XYZAL  Take 1 tablet (5 mg total) by mouth every evening.   Melatonin 10 MG Tabs Take 1 tablet at bedtime   methylPREDNISolone  4 MG Tbpk tablet Commonly known as: MEDROL  DOSEPAK Take as package instructions.   MULTIPLE VITAMIN PO Take 1 tablet by mouth daily.   potassium citrate 10 MEQ (1080 MG) SR tablet Commonly known as: UROCIT-K Take 10 mEq by mouth 3 (three) times daily.   traZODone  50 MG tablet Commonly known as: DESYREL  TAKE 1 & 1/2 TABLETS BY MOUTH 30 MINS BEFORE BEDTIME.        Birth History: non-contributory  Developmental History: non-contributory  Past Surgical History: Past Surgical History:  Procedure Laterality Date   BOTOX INJECTION     Dr. Jerolyn at [facility]   LITHOTRIPSY  01/2023   Kidney Stones     Family History: Family History  Problem Relation Age of Onset   Allergic rhinitis Mother    Allergic rhinitis Maternal Grandmother    Heart Problems Paternal Grandfather        Died at 72     Social History: Tamecca lives at home with her family. They live in a house that is [address] years old. There is wood in the main living areas and carpeting in the bedrooms. There is one cat in the home. There are not dust mite coverings on the bedding. There is no tobacco exposure in the home. She is currently a Archivist. There are no fumes, chemicals, or dust. [place] do not live near an interstate or industrial area.    Review of systems otherwise negative other than that mentioned in the HPI.    Objective:   Blood pressure 104/72, pulse 81, temperature 98.3 F (36.8 C), temperature source Temporal, height 4' 10 (1.473 m), weight 106 lb 9.6 oz (48.4 kg), SpO2 100%. Body mass index is 22.28 kg/m.     Physical Exam Vitals reviewed.  Constitutional:      Appearance: She is  well-developed.     Comments: Pleasant.  HENT:     Head: Normocephalic and atraumatic.     Right Ear: Tympanic membrane, ear canal and external ear normal. No drainage, swelling or tenderness. Tympanic membrane is not injected, scarred, erythematous, retracted or bulging.     Left Ear: Tympanic membrane, ear canal and external ear normal. No drainage, swelling or tenderness. Tympanic membrane is not injected, scarred, erythematous, retracted or bulging.     Nose: No nasal deformity, septal deviation, mucosal edema or rhinorrhea.     Right Sinus: No maxillary sinus tenderness or frontal  sinus tenderness.     Left Sinus: No maxillary sinus tenderness or frontal sinus tenderness.     Mouth/Throat:     Mouth: Mucous membranes are not pale and not dry.     Pharynx: Uvula midline.  Eyes:     General: Lids are normal. Allergic shiner present.        Right eye: No discharge.        Left eye: No discharge.     Conjunctiva/sclera: Conjunctivae normal.     Right eye: Right conjunctiva is not injected. No chemosis.    Left eye: Left conjunctiva is not injected. No chemosis.    Pupils: Pupils are equal, round, and reactive to light.  Cardiovascular:     Rate and Rhythm: Normal rate and regular rhythm.     Heart sounds: Normal heart sounds.  Pulmonary:     Effort: Pulmonary effort is normal. No tachypnea, accessory muscle usage or respiratory distress.     Breath sounds: Normal breath sounds. No wheezing, rhonchi or rales.     Comments: Moving air well in all lung fields.  No increased work of breathing. Chest:     Chest wall: No tenderness.  Abdominal:     Tenderness: There is no abdominal tenderness. There is no guarding or rebound.  Lymphadenopathy:     Head:     Right side of head: No submandibular, tonsillar or occipital adenopathy.     Left side of head: No submandibular, tonsillar or occipital adenopathy.     Cervical: No cervical adenopathy.  Skin:    Coloration: Skin is not pale.      Findings: No abrasion, erythema, petechiae or rash. Rash is not papular, urticarial or vesicular.  Neurological:     Mental Status: She is alert.  Psychiatric:        Behavior: Behavior is cooperative.      Diagnostic studies: deferred due to insurance stipulations that require a separate visit for testing         Marty Shaggy, MD [facility] 

## 2023-12-23 ENCOUNTER — Ambulatory Visit (INDEPENDENT_AMBULATORY_CARE_PROVIDER_SITE_OTHER): Admitting: Family

## 2023-12-23 ENCOUNTER — Encounter (INDEPENDENT_AMBULATORY_CARE_PROVIDER_SITE_OTHER): Payer: Self-pay | Admitting: Family

## 2023-12-23 VITALS — BP 108/76 | HR 92 | Ht 59.06 in | Wt 106.6 lb

## 2023-12-23 DIAGNOSIS — R52 Pain, unspecified: Secondary | ICD-10-CM | POA: Insufficient documentation

## 2023-12-23 DIAGNOSIS — F411 Generalized anxiety disorder: Secondary | ICD-10-CM | POA: Diagnosis not present

## 2023-12-23 DIAGNOSIS — G40309 Generalized idiopathic epilepsy and epileptic syndromes, not intractable, without status epilepticus: Secondary | ICD-10-CM

## 2023-12-23 DIAGNOSIS — R5382 Chronic fatigue, unspecified: Secondary | ICD-10-CM

## 2023-12-23 DIAGNOSIS — G40209 Localization-related (focal) (partial) symptomatic epilepsy and epileptic syndromes with complex partial seizures, not intractable, without status epilepticus: Secondary | ICD-10-CM

## 2023-12-23 DIAGNOSIS — H5713 Ocular pain, bilateral: Secondary | ICD-10-CM | POA: Diagnosis not present

## 2023-12-23 DIAGNOSIS — F409 Phobic anxiety disorder, unspecified: Secondary | ICD-10-CM

## 2023-12-23 DIAGNOSIS — Z79899 Other long term (current) drug therapy: Secondary | ICD-10-CM

## 2023-12-23 DIAGNOSIS — F5105 Insomnia due to other mental disorder: Secondary | ICD-10-CM

## 2023-12-23 MED ORDER — FLUOXETINE HCL 20 MG/5ML PO SOLN: 20.0000 mg | Freq: Every day | ORAL | 3 refills | Status: DC

## 2023-12-23 NOTE — Progress Notes (Unsigned)
 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 Bonnie Simon is taking and tolerating Carbamazepine  and has remained seizure free since July 2013. 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 Bonnie Simon is taking and tolerating Fluoxetine  for anxiety, as well as Clonidine  and Trazodone  for insomnia.   Today's concerns: Bonnie Simon  Bonnie Simon has been otherwise generally healthy since Bonnie Simon was last seen. No health concerns today other than previously mentioned.  Review of systems: Please see HPI for neurologic and other pertinent review of systems. Otherwise all other systems were reviewed and were negative.  Problem List: Patient Active Problem List   Diagnosis Date Noted   Chronic diarrhea 08/25/2023   Chronic fatigue 08/25/2023   Environmental and seasonal allergies 08/25/2023   Acute recurrent frontal sinusitis 08/25/2023   Encounter for general adult medical examination with abnormal findings 05/07/2023   Chronic scapular pain 05/07/2023   Scoliosis of lumbar spine 05/07/2023   Acute bacterial bronchitis 03/09/2023   Post-traumatic headache, not intractable 11/17/2022   Closed head injury 11/16/2022   Vertigo 11/12/2022   Nephrolithiasis 07/07/2022   Gastroesophageal reflux disease without esophagitis 07/07/2022   Irritable bowel syndrome 07/07/2022   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    Autism spectrum disorder 02/20/2017   Generalized anxiety disorder 07/05/2014   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   Partial epilepsy with impairment of consciousness (HCC) 11/05/2012   Episodic tension-type headache, not intractable 11/05/2012   Neonatal intraventricular hemorrhage (HCC) 11/05/2012     Past Medical History:  Diagnosis Date   Anxiety    Autism    Cerebral palsy (HCC)    Delayed milestones 11/05/2012   Headache(784.0)    History of prematurity 2004   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 Jerolyn at [facility]   LITHOTRIPSY  01/2023   Kidney Stones     Family history: family history includes Allergic rhinitis in her maternal grandmother and mother; Heart Problems in her paternal grandfather.   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   Bonnie Simon attends [employer]. Transferring to [employer] - Psychology.  Bonnie Simon enjoys playing marching band, basketball, soccer, and watching TV.   Bonnie Simon lives with her parents. Bonnie Simon has an adult sister that does not live at home.   Social Drivers of Corporate investment banker Strain: Not on file  Food Insecurity: Not on file  Transportation  Needs: Not on file  Physical Activity: Not on file  Stress: Not on file  Social Connections: Unknown (02/18/2022)   Received from [facility]   Social Network    Social Network: Not on file  Intimate Partner Violence: Unknown (02/18/2022)   Received from [facility]   HITS    Physically Hurt: Not on file    Insult or Talk Down To: Not on file    Threaten Physical Harm: Not on file    Scream or Curse: Not on file    Past/failed meds:  Allergies: Allergies  Allergen Reactions   Amoxicillin Rash   Penicillins Hives    Immunizations: Immunization History  Administered Date(s) Administered   DTaP / IPV 11/22/2007   Dtap, Unspecified 04/11/2003, 06/26/2003, 08/28/2003, 05/15/2004   HIB, Unspecified 04/12/2003, 06/26/2003, 08/28/2003, 05/15/2004   HPV 9-valent 07/13/2015, 01/29/2016   Hep A, Unspecified 08/21/2004, 11/22/2007   Hep B, Unspecified 04/12/2003, 05/18/2003, 06/26/2003, 11/29/2003   Influenza,inj,Quad PF,6+ Mos 03/04/2023   MMR 05/15/2004, 11/22/2007   MenQuadfi_Meningococcal Groups ACYW Conjugate 12/23/2019   Meningococcal B, OMV 12/23/2019, 02/28/2020   Meningococcal Conjugate 07/13/2015   PFIZER(Purple Top)SARS-COV-2 Vaccination 10/16/2019   PPD Test 08/26/2023   Pneumococcal Conjugate PCV 7 08/28/2003   Polio, Unspecified 04/11/2003, 06/26/2003, 11/29/2003   Tdap 07/13/2015   Varicella 02/21/2004, 11/22/2007    Diagnostics/Screenings: Copied from previous record: 09/13/2013 - rEEG - This is a abnormal record with the patient awake.  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  Elsie DEL. Hickling, M.D.   05/19/2007 - rEEG - Abnormal EEG on the basis of slowing over the right hemisphere, which is indicative of underlying structural and/or vascular  abnormality and would correlate with the patient's left hemiparesis. This may also be a result of a postictal change from a  right brain  signature seizure. Elsie Conner, M.D.   07/22/2006 - CT Head wo contrast - Patient had a remote periventricular hemorrhage with subsequent dilatation of the right lateral ventricle appearring similar to prior MR. The present exam is motion degraded without obvious intracranial hemorrhage or new mass identified.  Followup MR without motion may be considered for further delineation for investigation of seizure focus when the patient is able to cooperate or be properly sedated.    01/2017 - diagnosis of autism spectrum disorder by Dr Loel.  Physical Exam: There were no vitals taken for this visit.  General: Well developed, well nourished, seated, in no evident distress Head: Head normocephalic and atraumatic.  Oropharynx benign. Neck: Supple Cardiovascular: Regular rate and rhythm, no murmurs Respiratory: Breath sounds clear to auscultation Musculoskeletal: No obvious deformities or scoliosis Skin: No rashes or neurocutaneous lesions  Neurologic Exam Mental Status: Awake and fully alert.  Oriented to place and time.  Recent and remote memory intact.  Attention span, concentration, and fund of knowledge appropriate.  Mood and affect appropriate. Cranial Nerves: Fundoscopic exam reveals sharp disc margins.  Pupils equal, briskly reactive to light.  Extraocular movements full without nystagmus. Hearing intact and symmetric to whisper.  Facial sensation intact.  Face tongue, palate move normally and symmetrically. Shoulder shrug normal Motor: Normal bulk and tone. Normal strength  in all tested extremity muscles. Sensory: Intact to touch and temperature in all extremities.  Coordination: Rapid alternating movements normal in all extremities.  Finger-to-nose and heel-to shin performed accurately bilaterally.  Romberg negative. Gait and Station: Arises from chair without difficulty.  Stance is normal. Gait demonstrates normal stride length and balance.   Able to heel, toe and tandem  walk without difficulty. Reflexes: 1+ and symmetric. Toes downgoing.   Impression: No diagnosis found.    Recommendations for plan of care: The patient's previous Epic records were reviewed. No recent diagnostic studies to be reviewed with the patient.  Plan until next visit: Continue medications as prescribed  Call for questions or concerns No follow-ups on file.  The medication list was reviewed and reconciled. No changes were made in the prescribed medications today. A complete medication list was provided to the patient.  No orders of the defined types were placed in this encounter.    Allergies as of 12/23/2023       Reactions   Amoxicillin Rash   Penicillins Hives        Medication List        Accurate as of December 23, 2023  9:38 AM. If you have any questions, ask your nurse or doctor.          azelastine  0.1 % nasal spray Commonly known as: ASTELIN  Place 1 spray into both nostrils 2 (two) times daily. Use in each nostril as directed   carbamazepine  100 MG chewable tablet Commonly known as: TEGRETOL  CHEW 2 TABLETS BY MOUTH IN THE MORNING, 2 TABLETS IN THE AFTERNOON, AND 3 TABLETS AT BEDTIME.   cloNIDine  0.1 MG tablet Commonly known as: CATAPRES  TAKE 1 AND 1/2 TABLETS BY MOUTH 30 MINUTES BEFORE BEDTIME. MAY REPEAT 1/2 TABLET DURING THE NIGHT IF NEEDED FOR SLEEP   cyanocobalamin  500 MCG tablet Commonly known as: VITAMIN B12 Take 500 mcg by mouth daily.   famotidine  10 MG tablet Commonly known as: PEPCID  Take 1 tablet (10 mg total) by mouth 2 (two) times daily.   fexofenadine 180 MG tablet Commonly known as: ALLEGRA Take 180 mg by mouth daily.   FLUoxetine  20 MG capsule Commonly known as: PROZAC  TAKE ONE CAPSULE BY MOUTH EVERY DAY   fluticasone  50 MCG/ACT nasal spray Commonly known as: FLONASE  Place 2 sprays into both nostrils daily.   hydrochlorothiazide 12.5 MG tablet Commonly known as: HYDRODIURIL Take 1 tablet by mouth daily.    ibuprofen 200 MG tablet Commonly known as: ADVIL Take 1 tablet at onset of headache, may repeat in 6 hours if needed.   levocetirizine 5 MG tablet Commonly known as: XYZAL  Take 1 tablet (5 mg total) by mouth every evening.   Melatonin 10 MG Tabs Take 1 tablet at bedtime   methylPREDNISolone  4 MG Tbpk tablet Commonly known as: MEDROL  DOSEPAK Take as package instructions.   MULTIPLE VITAMIN PO Take 1 tablet by mouth daily.   potassium citrate 10 MEQ (1080 MG) SR tablet Commonly known as: UROCIT-K Take 10 mEq by mouth 3 (three) times daily.   traZODone  50 MG tablet Commonly known as: DESYREL  TAKE 1 & 1/2 TABLETS BY MOUTH 30 MINS BEFORE BEDTIME.            I discussed this patient's care with the multiple providers involved in her care today to develop this assessment and plan.   Total time spent with the patient was *** minutes, of which 50% or more was spent in counseling and coordination of care.  Ellouise Bollman NP-C [place] [address], [address] [place], [place] [address] Ph. [phone] Fax [phone]

## 2023-12-23 NOTE — Patient Instructions (Addendum)
 It was a pleasure to see you today!  Instructions for you until your next appointment are as follows: I have given you a blood test order to have done at [facility]. I will call you when I receive the results Follow up with your eye doctor about your eye pain Work on getting more sleep at night Continue counseling Continue taking your medications as prescribed Please sign up for MyChart if you have not done so. Please plan to return for follow up in 6 months or sooner if needed.  Feel free to contact our office during normal business hours at [phone] with questions or concerns. If there is no answer or the call is outside business hours, please leave a message and our clinic staff will call you back within the next business day.  If you have an urgent concern, please stay on the line for our after-hours answering service and ask for the on-call neurologist.     I also encourage you to use MyChart to communicate with me more directly. If you have not yet signed up for MyChart within [facility], the front desk staff can help you. However, please note that this inbox is NOT monitored on nights or weekends, and response can take up to 2 business days.  Urgent matters should be discussed with the on-call pediatric neurologist.   At [facility], we are committed to providing exceptional care. You will receive a patient satisfaction survey through text or email regarding your visit today. Your opinion is important to me. Comments are appreciated.

## 2023-12-24 ENCOUNTER — Encounter (INDEPENDENT_AMBULATORY_CARE_PROVIDER_SITE_OTHER): Payer: Self-pay | Admitting: Family

## 2023-12-24 MED ORDER — CLONIDINE HCL 0.1 MG PO TABS: ORAL_TABLET | ORAL | 5 refills | Status: AC

## 2023-12-24 MED ORDER — CARBAMAZEPINE 100 MG PO CHEW: CHEWABLE_TABLET | ORAL | 5 refills | Status: AC

## 2023-12-24 MED ORDER — TRAZODONE HCL 50 MG PO TABS: ORAL_TABLET | ORAL | 5 refills | Status: AC

## 2023-12-29 DIAGNOSIS — Z79899 Other long term (current) drug therapy: Secondary | ICD-10-CM | POA: Diagnosis not present

## 2023-12-29 DIAGNOSIS — R5382 Chronic fatigue, unspecified: Secondary | ICD-10-CM | POA: Diagnosis not present

## 2023-12-29 DIAGNOSIS — G40309 Generalized idiopathic epilepsy and epileptic syndromes, not intractable, without status epilepticus: Secondary | ICD-10-CM | POA: Diagnosis not present

## 2023-12-29 DIAGNOSIS — R52 Pain, unspecified: Secondary | ICD-10-CM | POA: Diagnosis not present

## 2023-12-30 LAB — C-REACTIVE PROTEIN: CRP: <1 mg/L (ref 0–10)

## 2023-12-30 LAB — SEDIMENTATION RATE: Sed Rate: 2 mm/h (ref 0–32)

## 2023-12-31 ENCOUNTER — Ambulatory Visit (INDEPENDENT_AMBULATORY_CARE_PROVIDER_SITE_OTHER): Payer: Self-pay | Admitting: Family

## 2024-01-09 ENCOUNTER — Other Ambulatory Visit (INDEPENDENT_AMBULATORY_CARE_PROVIDER_SITE_OTHER): Payer: Self-pay | Admitting: Family

## 2024-01-09 DIAGNOSIS — F411 Generalized anxiety disorder: Secondary | ICD-10-CM

## 2024-01-13 DIAGNOSIS — F411 Generalized anxiety disorder: Secondary | ICD-10-CM | POA: Diagnosis not present

## 2024-01-15 ENCOUNTER — Ambulatory Visit: Admitting: Allergy & Immunology

## 2024-01-15 DIAGNOSIS — B999 Unspecified infectious disease: Secondary | ICD-10-CM

## 2024-01-15 DIAGNOSIS — J3089 Other allergic rhinitis: Secondary | ICD-10-CM

## 2024-01-15 DIAGNOSIS — J302 Other seasonal allergic rhinitis: Secondary | ICD-10-CM | POA: Diagnosis not present

## 2024-01-15 MED ORDER — XHANCE 93 MCG/ACT NA EXHU: 2.0000 | INHALANT_SUSPENSION | Freq: Two times a day (BID) | NASAL | 5 refills | Status: DC

## 2024-01-15 NOTE — Patient Instructions (Addendum)
 1. Chronic rhinitis - Testing today showed: indoor molds and outdoor molds - Copy of test results provided.  - Avoidance measures provided. - Continue with: Xhance  (fluticasone ) 1-2 sprays per nostril twice daily - Nasal saline rinse kit provided as well. - We will send in the Xhance  to [facility] which will contact you to let you know how to get the product. - Consider allergy shots as a means of long-term control. - Allergy shots re-train and reset the immune system to ignore environmental allergens and decrease the resulting immune response to those allergens (sneezing, itchy watery eyes, runny nose, nasal congestion, etc).    - Allergy shots improve symptoms in 75-85% of patients.  - We can discuss more at the next appointment if the medications are not working for you.  2. Recurrent sinusitis - Since the testing was not super impressive, we are going to do an immune workup.  - We will obtain some screening labs to evaluate your immune system.  - Labs to evaluate the quantitative [facility]) aspects of your immune system: IgG/IgA/IgM, CBC with differential - Labs to evaluate the qualitative (HOW WELL THEY WORK) aspects of your immune system: CH50, Pneumococcal titers, Tetanus titers, Diphtheria titers - We may consider immunizations with Pneumovax and Tdap to challenge your immune system, and then obtain repeat titers in 4-6 weeks.   3. Exercise induced asthma - Lung testing not done.   4. Return in about 2 months (around 03/16/2024). You can have the follow up appointment with Dr. Iva or a Nurse Practicioner (our Nurse Practitioners are excellent and always have Physician oversight!).    Please inform us  of any Emergency Department visits, hospitalizations, or changes in symptoms. Call us  before going to the ED for breathing or allergy symptoms since we might be able to fit you in for a sick visit. Feel free to contact us  anytime with any questions, problems, or  concerns.  It was a pleasure to see you again today!  Websites that have reliable patient information: 1. American Academy of Asthma, Allergy, and Immunology: www.aaaai.org 2. Food Allergy Research and Education (FARE): foodallergy.org 3. Mothers of Asthmatics: http://www.asthmacommunitynetwork.org 4. American College of Allergy, Asthma, and Immunology: www.acaai.org      "Like" us  on Facebook and Instagram for our latest updates!      A healthy democracy works best when [employer] participate! Make sure you are registered to vote! If you have moved or changed any of your contact information, you will need to get this updated before voting! Scan the QR codes below to learn more!        Airborne Adult Perc - 01/15/24 1507     Time Antigen Placed 1445    Allergen Manufacturer Jestine    Location Back    Number of Test 55    1. Control-Buffer 50% Glycerol Negative    2. Control-Histamine 3+    3. Bahia Negative    4. Bermuda Negative    5. Johnson Negative    6. [place]  Blue Negative    7. Meadow Fescue Negative    8. Perennial Rye Negative    9. Timothy Negative    10. Ragweed Mix Negative    11. Cocklebur Negative    12. Plantain,  English Negative    13. Baccharis Negative    14. Dog Fennel Negative    15. Russian Thistle Negative    16. Lamb's Quarters Negative    17. Sheep Sorrell Negative    18. Rough  Pigweed Negative    19. Marsh Elder, Rough Negative    20. Mugwort, Common Negative    21. Box, Elder Negative    22. Cedar, red Negative    23. Sweet Gum Negative    24. Pecan Pollen Negative    25. Pine Mix Negative    26. Walnut, Black Pollen Negative    27. Red Mulberry Negative    28. Ash Mix Negative    29. Birch Mix Negative    30. Beech American Negative    31. Cottonwood, Eastern Negative    32. Hickory, White Negative    33. Maple Mix Negative    34. Oak, Eastern Mix Negative    35. Sycamore Eastern Negative    36. Alternaria Alternata  Negative    37. Cladosporium Herbarum Negative    38. Aspergillus Mix Negative    39. Penicillium Mix Negative    40. Bipolaris Sorokiniana (Helminthosporium) Negative    41. Drechslera Spicifera (Curvularia) Negative    42. Mucor Plumbeus Negative    43. Fusarium Moniliforme Negative    44. Aureobasidium Pullulans (pullulara) Negative    45. Rhizopus Oryzae Negative    46. Botrytis Cinera Negative    47. Epicoccum Nigrum Negative    48. Phoma Betae Negative    49. Dust Mite Mix Negative    50. Cat Hair 10,000 BAU/ml Negative    51.  Dog Epithelia Negative    52. Mixed Feathers Negative    53. Horse Epithelia Negative    54. Cockroach, German Negative    55. Tobacco Leaf Negative          13 Food Perc - 01/15/24 1508       Test Information   Time Antigen Placed 1445    Allergen Manufacturer Jestine    Location Back    Number of allergen test 3      Food   3. Wheat Negative    5. Milk, Cow Negative    6. Casein Negative          Intradermal - 01/15/24 1508     Time Antigen Placed 1515    Allergen Manufacturer Jestine    Location Arm    Number of Test 16    Intradermal Select    Control Negative    Bahia Negative    Bermuda Negative    Johnson Negative    7 Grass Negative    Ragweed Mix Negative    Weed Mix Negative    Tree Mix Negative    Mold 1 2+    Mold 2 2+    Mold 3 Negative    Mold 4 Negative    Mite Mix Negative    Cat Negative    Dog Negative    Cockroach Negative          Control of Mold Allergen   Mold and fungi can grow on a variety of surfaces provided certain temperature and moisture conditions exist.  Outdoor molds grow on plants, decaying vegetation and soil.  The major outdoor mold, Alternaria and Cladosporium, are found in very high numbers during hot and dry conditions.  Generally, a late Summer - Fall peak is seen for common outdoor fungal spores.  Rain will temporarily lower outdoor mold spore count, but counts rise rapidly when  the rainy period ends.  The most important indoor molds are Aspergillus and Penicillium.  Dark, humid and poorly ventilated basements are ideal sites for mold growth.  The next most common  sites of mold growth are the bathroom and the kitchen.  Outdoor (Seasonal) Mold Control  Positive outdoor molds via skin testing: Alternaria and Cladosporium  Use air conditioning and keep windows closed Avoid exposure to decaying vegetation. Avoid leaf raking. Avoid grain handling. Consider wearing a face mask if working in moldy areas.    Indoor (Perennial) Mold Control   Positive indoor molds via skin testing: Aspergillus and Penicillium  Maintain humidity below 50%. Clean washable surfaces with 5% bleach solution. Remove sources e.g. contaminated carpets.    Allergy Shots  Allergies are the result of a chain reaction that starts in the immune system. Your immune system controls how your body defends itself. For instance, if you have an allergy to pollen, your immune system identifies pollen as an invader or allergen. Your immune system overreacts by producing antibodies called Immunoglobulin E (IgE). These antibodies travel to cells that release chemicals, causing an allergic reaction.  The concept behind allergy immunotherapy, whether it is received in the form of shots or tablets, is that the immune system can be desensitized to specific allergens that trigger allergy symptoms. Although it requires time and patience, the payback can be long-term relief. Allergy injections contain a dilute solution of those substances that you are allergic to based upon your skin testing and allergy history.   How Do Allergy Shots Work?  Allergy shots work much like a vaccine. Your body responds to injected amounts of a particular allergen given in increasing doses, eventually developing a resistance and tolerance to it. Allergy shots can lead to decreased, minimal or no allergy symptoms.  There generally are  two phases: build-up and maintenance. Build-up often ranges from three to six months and involves receiving injections with increasing amounts of the allergens. The shots are typically given once or twice a week, though more rapid build-up schedules are sometimes used.  The maintenance phase begins when the most effective dose is reached. This dose is different for each person, depending on how allergic you are and your response to the build-up injections. Once the maintenance dose is reached, there are longer periods between injections, typically two to four weeks.  Occasionally doctors give cortisone-type shots that can temporarily reduce allergy symptoms. These types of shots are different and should not be confused with allergy immunotherapy shots.  Who Can Be Treated with Allergy Shots?  Allergy shots may be a good treatment approach for people with allergic rhinitis (hay fever), allergic asthma, conjunctivitis (eye allergy) or stinging insect allergy.   Before deciding to begin allergy shots, you should consider:   The length of allergy season and the severity of your symptoms  Whether medications and/or changes to your environment can control your symptoms  Your desire to avoid long-term medication use  Time: allergy immunotherapy requires a major time commitment  Cost: may vary depending on your insurance coverage  Allergy shots for children age 44 and older are effective and often well tolerated. They might prevent the onset of new allergen sensitivities or the progression to asthma.  Allergy shots are not started on patients who are pregnant but can be continued on patients who become pregnant while receiving them. In some patients with other medical conditions or who take certain common medications, allergy shots may be of risk. It is important to mention other medications you talk to your allergist.   What are the two types of build-ups offered:   RUSH or Rapid Desensitization  -- one day of injections lasting from 8:30-4:30pm, injections  every 1 hour.  Approximately half of the build-up process is completed in that one day.  The following week, normal build-up is resumed, and this entails ~16 visits either weekly or twice weekly, until reaching your "maintenance dose" which is continued weekly until eventually getting spaced out to every month for a duration of 3 to 5 years. The regular build-up appointments are nurse visits where the injections are administered, followed by required monitoring for 30 minutes.    Traditional build-up -- weekly visits for 6 -12 months until reaching "maintenance dose", then continue weekly until eventually spacing out to every 4 weeks as above. At these appointments, the injections are administered, followed by required monitoring for 30 minutes.     Either way is acceptable, and both are equally effective. With the rush protocol, the advantage is that less time is spent here for injections overall AND you would also reach maintenance dosing faster (which is when the clinical benefit starts to become more apparent). Not everyone is a candidate for rapid desensitization.   IF we proceed with the RUSH protocol, there are premedications which must be taken the day before and the day after the rush only (this includes antihistamines, steroids, and Singulair).  After the rush day, no prednisone or Singulair is required, and we just recommend antihistamines taken on your injection day.  What Is An Estimate of the Costs?  If you are interested in starting allergy injections, please check with your insurance company about your coverage for both allergy vial sets and allergy injections.  Please do so prior to making the appointment to start injections.  The following are CPT codes to give to your insurance company. These are the amounts we BILL to the insurance company, but the amount YOU WILL PAY and WE RECEIVE IS SUBSTANTIALLY LESS and depends on the  contracts we have with different insurance companies.   Amount Billed to Insurance One allergy vial set  CPT 95165   $ 1200     One injection   CPT 95115   $ 35  RUSH (Rapid Desensitization) CPT 95180 x 8 hours  $500/hour  Regarding the allergy injections, your co-pay may or may not apply with each injection, so please confirm this with your insurance company. When you start allergy injections, 1 or 2 sets of vials are made based on your allergies.  Not all patients can be on one set of vials. A set of vials lasts 6 months to a year depending on how quickly you can proceed with your build-up of your allergy injections. Vials are personalized for each patient depending on their specific allergens.  How often are allergy injection given during the build-up period?   Injections are given at least weekly during the build-up period until your maintenance dose is achieved. Per the doctor's discretion, you may have the option of getting allergy injections two times per week during the build-up period. However, there must be at least 48 hours between injections. The build-up period is usually completed within 6-12 months depending on your ability to schedule injections and for adjustments for reactions. When maintenance dose is reached, your injection schedule is gradually changed to every two weeks and later to every three weeks. Injections will then continue every 4 weeks. Usually, injections are continued for a total of 3-5 years.   When Will I Feel Better?  Some may experience decreased allergy symptoms during the build-up phase. For others, it may take as long as 12 months on the maintenance  dose. If there is no improvement after a year of maintenance, your allergist will discuss other treatment options with you.  If you aren't responding to allergy shots, it may be because there is not enough dose of the allergen in your vaccine or there are missing allergens that were not identified during your  allergy testing. Other reasons could be that there are high levels of the allergen in your environment or major exposure to non-allergic triggers like tobacco smoke.  What Is the Length of Treatment?  Once the maintenance dose is reached, allergy shots are generally continued for three to five years. The decision to stop should be discussed with your allergist at that time. Some people may experience a permanent reduction of allergy symptoms. Others may relapse and a longer course of allergy shots can be considered.  What Are the Possible Reactions?  The two types of adverse reactions that can occur with allergy shots are local and systemic. Common local reactions include very mild redness and swelling at the injection site, which can happen immediately or several hours after. Report a delayed reaction from your last injection. These include arm swelling or runny nose, watery eyes or cough that occurs within 12-24 hours after injection. A systemic reaction, which is less common, affects the entire body or a particular body system. They are usually mild and typically respond quickly to medications. Signs include increased allergy symptoms such as sneezing, a stuffy nose or hives.   Rarely, a serious systemic reaction called anaphylaxis can develop. Symptoms include swelling in the throat, wheezing, a feeling of tightness in the chest, nausea or dizziness. Most serious systemic reactions develop within 30 minutes of allergy shots. This is why it is strongly recommended you wait in your doctor's office for 30 minutes after your injections. Your allergist is trained to watch for reactions, and his or her staff is trained and equipped with the proper medications to identify and treat them.   Report to the nurse immediately if you experience any of the following symptoms: swelling, itching or redness of the skin, hives, watery eyes/nose, breathing difficulty, excessive sneezing, coughing, stomach pain,  diarrhea, or light headedness. These symptoms may occur within 15-20 minutes after injection and may require medication.   Who Should Administer Allergy Shots?  The preferred location for receiving shots is your prescribing allergist's office. Injections can sometimes be given at another facility where the physician and staff are trained to recognize and treat reactions, and have received instructions by your prescribing allergist.  What if I am late for an injection?   Injection dose will be adjusted depending upon how many days or weeks you are late for your injection.   What if I am sick?   Please report any illness to the nurse before receiving injections. She may adjust your dose or postpone injections depending on your symptoms. If you have fever, flu, sinus infection or chest congestion it is best to postpone allergy injections until you are better. Never get an allergy injection if your asthma is causing you problems. If your symptoms persist, seek out medical care to get your health problem under control.  What If I am or Become Pregnant:  Women that become pregnant should schedule an appointment with [facility] before receiving any further allergy injections.

## 2024-01-15 NOTE — Progress Notes (Signed)
 FOLLOW UP  Date of Service/Encounter:  01/15/24   Assessment:   Perennial and seasonal allergic rhinitis (indoor and outdoor molds)  Failed Flonase , Nasacort, Astelin  (doing better with Xhance )   Penicillin allergy - last rxn in the 5th grade (consider challenge in the future)   Recurrent sinusitis - getting immune workup   Food intolerance (gluten, lactose) - negative testing today  Plan/Recommendations:   1. Chronic rhinitis - Testing today showed: indoor molds and outdoor molds - Copy of test results provided.  - Avoidance measures provided. - Continue with: Xhance  (fluticasone ) 1-2 sprays per nostril twice daily - Nasal saline rinse kit provided as well. - We will send in the Xhance  to [facility] which will contact you to let you know how to get the product. - Consider allergy shots as a means of long-term control. - Allergy shots re-train and reset the immune system to ignore environmental allergens and decrease the resulting immune response to those allergens (sneezing, itchy watery eyes, runny nose, nasal congestion, etc).    - Allergy shots improve symptoms in 75-85% of patients.  - We can discuss more at the next appointment if the medications are not working for you.  2. Recurrent sinusitis - Since the testing was not super impressive, we are going to do an immune workup.  - We will obtain some screening labs to evaluate your immune system.  - Labs to evaluate the quantitative [facility]) aspects of your immune system: IgG/IgA/IgM, CBC with differential - Labs to evaluate the qualitative (HOW WELL THEY WORK) aspects of your immune system: CH50, Pneumococcal titers, Tetanus titers, Diphtheria titers - We may consider immunizations with Pneumovax and Tdap to challenge your immune system, and then obtain repeat titers in 4-6 weeks.   3. Exercise induced asthma - Lung testing not done.   4. Return in about 2 months (around 03/16/2024). You can have the follow  up appointment with Dr. Iva or a Nurse Practicioner (our Nurse Practitioners are excellent and always have Physician oversight!).   Subjective:   Bonnie Simon is a 23 y.o. female presenting today for follow up of No chief complaint on file.   Bonnie Simon has a history of the following: Patient Active Problem List   Diagnosis Date Noted   Pain and tenderness 12/23/2023   Encounter for long-term current use of medication 12/23/2023   Eye pain, bilateral 12/23/2023   Chronic diarrhea 08/25/2023   Chronic fatigue 08/25/2023   Environmental and seasonal allergies 08/25/2023   Acute recurrent frontal sinusitis 08/25/2023   Encounter for general adult medical examination with abnormal findings 05/07/2023   Chronic scapular pain 05/07/2023   Scoliosis of lumbar spine 05/07/2023   Acute bacterial bronchitis 03/09/2023   Post-traumatic headache, not intractable 11/17/2022   Closed head injury 11/16/2022   Vertigo 11/12/2022   Nephrolithiasis 07/07/2022   Gastroesophageal reflux disease without esophagitis 07/07/2022   Irritable bowel syndrome 07/07/2022   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  Discussed the use of AI scribe software for clinical note transcription with the patient and/or guardian, who gave verbal consent to proceed.  Bonnie Simon is a 23 y.o. female presenting for skin testing. She was last seen on October 2025. We could not do testing because her insurance company does not cover testing on the same day as a New Patient visit. She has been off  of all antihistamines 3 days in anticipation of the testing.   We last saw her in October 2025. At that time, we started her on Xhance  one spray per nostril daily to see if this would help with her rhinitis symptoms. We deferred on an immune workup since we wanted to do testing first. We did not do testing for her lungs, but she does have a history of EIB.   Her allergy symptoms worsen during the summer months, and she required medical attention, including receiving a shot at the doctor's office.  She experiences sinus infections that start with sinus congestion and are challenging to resolve. She has undergone three courses of antibiotics for her most recent sinus infection, leading to her referral to this clinic.  She has a history of stomach pain associated with wheat consumption, although testing for celiac disease was negative. She also experiences abdominal pain with milk consumption.  She is currently using a new nasal spray for her symptoms, which she feels is slightly more effective than previous treatments.  Otherwise, there have been no changes to her past medical history, surgical history, family history, or social history.    Review of systems otherwise negative other than that mentioned in the HPI.    Objective:   Last menstrual period 11/29/2023. There is no height or weight on file to calculate BMI.    Physical exam deferred since this was a skin testing appointment only.   Diagnostic studies:   Allergy Studies:     Airborne Adult Perc - 01/15/24 1507     Time Antigen Placed 1445    Allergen Manufacturer Jestine    Location Back    Number of Test 55    1. Control-Buffer 50% Glycerol Negative    2. Control-Histamine 3+    3. Bahia Negative    4. Bermuda Negative    5. Johnson Negative    6. [place]  Blue Negative    7. Meadow Fescue Negative    8. Perennial Rye Negative    9. Timothy Negative    10. Ragweed Mix Negative    11. Cocklebur Negative    12.  Plantain,  English Negative    13. Baccharis Negative    14. Dog Fennel Negative    15. Russian Thistle Negative    16. Lamb's Quarters Negative    17. Sheep Sorrell Negative    18. Rough Pigweed Negative    19. Marsh Elder, Rough Negative    20. Mugwort, Common Negative    21. Box, Elder Negative    22. Cedar, red Negative    23. Sweet Gum Negative    24. Pecan Pollen Negative    25. Pine Mix Negative    26. Walnut, Black Pollen Negative    27. Red Mulberry Negative    28. Ash Mix Negative    29. Birch Mix Negative    30. Beech American Negative    31. Cottonwood, Eastern Negative    32. Hickory, White Negative    33. Maple Mix Negative    34. Oak, Eastern Mix Negative    35. Sycamore Eastern Negative    36. Alternaria Alternata Negative    37. Cladosporium Herbarum Negative    38. Aspergillus Mix Negative    39.  Penicillium Mix Negative    40. Bipolaris Sorokiniana (Helminthosporium) Negative    41. Drechslera Spicifera (Curvularia) Negative    42. Mucor Plumbeus Negative    43. Fusarium Moniliforme Negative    44. Aureobasidium Pullulans (pullulara) Negative    45. Rhizopus Oryzae Negative    46. Botrytis Cinera Negative    47. Epicoccum Nigrum Negative    48. Phoma Betae Negative    49. Dust Mite Mix Negative    50. Cat Hair 10,000 BAU/ml Negative    51.  Dog Epithelia Negative    52. Mixed Feathers Negative    53. Horse Epithelia Negative    54. Cockroach, German Negative    55. Tobacco Leaf Negative          13 Food Perc - 01/15/24 1508       Test Information   Time Antigen Placed 1445    Allergen Manufacturer Jestine    Location Back    Number of allergen test 3      Food   3. Wheat Negative    5. Milk, Cow Negative    6. Casein Negative          Intradermal - 01/15/24 1508     Time Antigen Placed 1515    Allergen Manufacturer Jestine    Location Arm    Number of Test 16    Intradermal Select    Control Negative    Bahia Negative    Bermuda  Negative    Johnson Negative    7 Grass Negative    Ragweed Mix Negative    Weed Mix Negative    Tree Mix Negative    Mold 1 2+    Mold 2 2+    Mold 3 Negative    Mold 4 Negative    Mite Mix Negative    Cat Negative    Dog Negative    Cockroach Negative          Allergy testing results were read and interpreted by myself, documented by clinical staff.      Marty Shaggy, MD  [facility] 

## 2024-01-18 ENCOUNTER — Encounter: Payer: Self-pay | Admitting: Allergy & Immunology

## 2024-01-18 NOTE — Addendum Note (Signed)
 Addended by: Lashya Passe E on: 01/18/2024 05:25 PM   Modules accepted: Orders

## 2024-01-18 NOTE — Addendum Note (Signed)
 Addended by: Saria Haran E on: 01/18/2024 05:21 PM   Modules accepted: Orders

## 2024-01-20 DIAGNOSIS — F411 Generalized anxiety disorder: Secondary | ICD-10-CM | POA: Diagnosis not present

## 2024-01-21 LAB — DIPHTHERIA / TETANUS ANTIBODY PANEL
Diphtheria Ab: <0.1 [IU]/mL — AB (ref <0.10)
Tetanus Ab, IgG: 0.12 [IU]/mL (ref <0.10)

## 2024-01-21 LAB — CBC WITH DIFF/PLATELET
Basophils Absolute: 0 x10E3/uL (ref 0.0–0.2)
Basos: 1 %
EOS (ABSOLUTE): 0 x10E3/uL (ref 0.0–0.4)
Eos: 0 %
Hematocrit: 43.9 % (ref 34.0–46.6)
Hemoglobin: 15.2 g/dL (ref 11.1–15.9)
Immature Grans (Abs): 0 x10E3/uL (ref 0.0–0.1)
Immature Granulocytes: 0 %
Lymphocytes Absolute: 1.8 x10E3/uL (ref 0.7–3.1)
Lymphs: 32 %
MCH: 33.9 pg — ABNORMAL HIGH (ref 26.6–33.0)
MCHC: 34.6 g/dL (ref 31.5–35.7)
MCV: 98 fL — ABNORMAL HIGH (ref 79–97)
Monocytes Absolute: 0.5 x10E3/uL (ref 0.1–0.9)
Monocytes: 10 %
Neutrophils Absolute: 3.2 x10E3/uL (ref 1.4–7.0)
Neutrophils: 57 %
Platelets: 274 x10E3/uL (ref 150–450)
RBC: 4.48 x10E6/uL (ref 3.77–5.28)
RDW: 11.6 % — ABNORMAL LOW (ref 11.7–15.4)
WBC: 5.6 x10E3/uL (ref 3.4–10.8)

## 2024-01-21 LAB — STREP PNEUMONIAE 23 SEROTYPES IGG
Pneumo Ab Type 1*: 0.8 ug/mL — AB (ref >1.3)
Pneumo Ab Type 12 (12F)*: 0.4 ug/mL — AB (ref >1.3)
Pneumo Ab Type 14*: 4.8 ug/mL (ref >1.3)
Pneumo Ab Type 17 (17F)*: 0.9 ug/mL — AB (ref >1.3)
Pneumo Ab Type 19 (19F)*: 11 ug/mL (ref >1.3)
Pneumo Ab Type 2*: <0.2 ug/mL — AB (ref >1.3)
Pneumo Ab Type 20*: 7.3 ug/mL (ref >1.3)
Pneumo Ab Type 22 (22F)*: <0.1 ug/mL — AB (ref >1.3)
Pneumo Ab Type 23 (23F)*: 12.1 ug/mL (ref >1.3)
Pneumo Ab Type 26 (6B)*: 0.5 ug/mL — AB (ref >1.3)
Pneumo Ab Type 3*: 1.5 ug/mL (ref >1.3)
Pneumo Ab Type 34 (10A)*: 8.8 ug/mL (ref >1.3)
Pneumo Ab Type 4*: 0.7 ug/mL — AB (ref >1.3)
Pneumo Ab Type 43 (11A)*: 0.8 ug/mL — AB (ref >1.3)
Pneumo Ab Type 5*: <0.1 ug/mL — AB (ref >1.3)
Pneumo Ab Type 51 (7F)*: 0.6 ug/mL — AB (ref >1.3)
Pneumo Ab Type 54 (15B)*: 0.5 ug/mL — AB (ref >1.3)
Pneumo Ab Type 56 (18C)*: <0.1 ug/mL — AB (ref >1.3)
Pneumo Ab Type 57 (19A)*: 2.6 ug/mL (ref >1.3)
Pneumo Ab Type 68 (9V)*: 1.4 ug/mL (ref >1.3)
Pneumo Ab Type 70 (33F)*: 3.4 ug/mL (ref >1.3)
Pneumo Ab Type 8*: <0.3 ug/mL — AB (ref >1.3)
Pneumo Ab Type 9 (9N)*: <0.1 ug/mL — AB (ref >1.3)

## 2024-01-21 LAB — IGG, IGA, IGM
IgG (Immunoglobin G), Serum: 1085 mg/dL (ref 586–1602)
IgM (Immunoglobulin M), Srm: 121 mg/dL (ref 26–217)
Immunoglobulin A, (IgA) QN, Serum: 309 mg/dL (ref 87–352)

## 2024-01-21 LAB — COMPLEMENT, TOTAL: Compl, Total (CH50): 52 U/mL (ref >41)

## 2024-01-27 ENCOUNTER — Ambulatory Visit: Payer: Self-pay | Admitting: Allergy & Immunology

## 2024-01-27 DIAGNOSIS — F411 Generalized anxiety disorder: Secondary | ICD-10-CM | POA: Diagnosis not present

## 2024-02-02 ENCOUNTER — Telehealth: Payer: Self-pay

## 2024-02-02 NOTE — Telephone Encounter (Signed)
*  [facility]  Pharmacy Patient Advocate Encounter   Received notification from Fax that prior authorization for Xhance  is required/requested.   Insurance verification completed.   The patient is insured through [facility].   Per test claim: PA required; PA submitted to above mentioned insurance via Latent Key/confirmation #/EOC [facility] Status is pending

## 2024-02-02 NOTE — Telephone Encounter (Signed)
 Your request has been approved PA Case: [id number], Status: Approved, Coverage Starts on: 02/02/2024 12:00:00 AM, Coverage Ends on: 02/01/2025 12:00:00 AM. Authorization Expiration12/04/2024

## 2024-02-03 DIAGNOSIS — F411 Generalized anxiety disorder: Secondary | ICD-10-CM | POA: Diagnosis not present

## 2024-02-08 ENCOUNTER — Other Ambulatory Visit: Payer: Self-pay | Admitting: Allergy & Immunology

## 2024-02-10 DIAGNOSIS — F411 Generalized anxiety disorder: Secondary | ICD-10-CM | POA: Diagnosis not present

## 2024-02-17 DIAGNOSIS — F411 Generalized anxiety disorder: Secondary | ICD-10-CM | POA: Diagnosis not present

## 2024-02-18 DIAGNOSIS — N760 Acute vaginitis: Secondary | ICD-10-CM | POA: Diagnosis not present

## 2024-02-23 ENCOUNTER — Other Ambulatory Visit (INDEPENDENT_AMBULATORY_CARE_PROVIDER_SITE_OTHER): Payer: Self-pay | Admitting: Family

## 2024-02-23 DIAGNOSIS — F411 Generalized anxiety disorder: Secondary | ICD-10-CM

## 2024-03-01 ENCOUNTER — Other Ambulatory Visit (INDEPENDENT_AMBULATORY_CARE_PROVIDER_SITE_OTHER): Payer: Self-pay

## 2024-03-01 DIAGNOSIS — F411 Generalized anxiety disorder: Secondary | ICD-10-CM

## 2024-03-01 MED ORDER — FLUOXETINE HCL 20 MG/5ML PO SOLN: 20.0000 mg | Freq: Every day | ORAL | 0 refills | Status: AC

## 2024-03-02 DIAGNOSIS — F411 Generalized anxiety disorder: Secondary | ICD-10-CM | POA: Diagnosis not present

## 2024-03-07 ENCOUNTER — Other Ambulatory Visit (INDEPENDENT_AMBULATORY_CARE_PROVIDER_SITE_OTHER): Payer: Self-pay | Admitting: Family

## 2024-03-07 DIAGNOSIS — F411 Generalized anxiety disorder: Secondary | ICD-10-CM

## 2024-03-11 ENCOUNTER — Ambulatory Visit: Admitting: Family Medicine

## 2024-03-11 ENCOUNTER — Encounter: Payer: Self-pay | Admitting: Family Medicine

## 2024-03-11 ENCOUNTER — Other Ambulatory Visit: Payer: Self-pay

## 2024-03-11 VITALS — BP 116/82 | HR 110 | Temp 98.4°F | Resp 18 | Ht <= 58 in | Wt 108.4 lb

## 2024-03-11 DIAGNOSIS — J302 Other seasonal allergic rhinitis: Secondary | ICD-10-CM

## 2024-03-11 DIAGNOSIS — B999 Unspecified infectious disease: Secondary | ICD-10-CM | POA: Diagnosis not present

## 2024-03-11 DIAGNOSIS — J3089 Other allergic rhinitis: Secondary | ICD-10-CM | POA: Diagnosis not present

## 2024-03-11 MED ORDER — AZELASTINE-FLUTICASONE 137-50 MCG/ACT NA SUSP: 2.0000 | NASAL | 5 refills | Status: AC

## 2024-03-11 NOTE — Progress Notes (Signed)
 "  [address] [place] [address] Dept: [phone]  FOLLOW UP NOTE  Patient ID: Bonnie Simon, female    DOB: 2000/03/09  Age: 24 y.o. MRN: [id number] Date of Office Visit: 03/11/2024  Assessment  Chief Complaint: Follow-up (Allergies still bad. No relief.)  HPI Bonnie Simon is a 24 year old female who presents to the clinic for follow-up visit.  She was last seen in this clinic on 01/15/2024 by Dr. Iva for evaluation of asthma, allergic rhinitis, recurrent infection, penicillin allergy , and food intolerance to gluten and lactose with negative testing.  Her last environmental allergy  testing on 01/15/2024 was positive to mold.  Discussed the use of AI scribe software for clinical note transcription with the patient, who gave verbal consent to proceed.  History of Present Illness Bonnie Simon is a 24 year old female who presents with persistent allergy  symptoms.  She has experienced persistent allergy  symptoms since the summer, characterized by nasal congestion, sneezing, and clear nasal discharge, along with nasal pain and postnasal drip. These symptoms have persisted despite previous treatments.  She has been using a steroid nasal spray, initially prescribed by her doctor, but reports no significant improvement in her symptoms. She previously used Xhance  but did not purchase a refill due to cost concerns and perceived lack of efficacy.  Allergy  testing indicated a mold allergy . Symptoms began in the summer and have persisted, unlike previous years when she only experienced illness around Christmas.  She has a history of pneumonia and recurrent sinus infections, typically occurring annually, but notes that this year her symptoms have persisted throughout the year.  No shortness of breath, wheezing, or coughing, and she has not required the use of an inhaler. She has not experienced any infections since her last visit.  She is a consulting civil engineer  at [employer].     Drug Allergies:  Allergies[1]  Physical Exam: BP 116/82 (BP Location: Left Arm, Patient Position: Sitting, Cuff Size: Normal)   Pulse (!) 110   Temp 98.4 F (36.9 C) (Temporal)   Resp 18   Ht 4' 10 (1.473 m)   Wt 108 lb 6.4 oz (49.2 kg)   SpO2 98%   BMI 22.66 kg/m    Physical Exam  Diagnostics:    Assessment and Plan: No diagnosis found.  No orders of the defined types were placed in this encounter.   Patient Instructions  Asthma  Allergic rhinitis Continue allergen avoidance measures directed toward mold as listed below Continue Xhance  2 sprays in each nostril twice a day if needed for nasal symptoms Consider saline nasal rinses as needed for nasal symptoms. Use this before any medicated nasal sprays for best result Consider allergen immunotherapy if your symptoms are not well-controlled with the treatment plan as listed above  Recurrent infection Keep track of antibiotic use, steroid use, and infections  Food intolerance Continue to avoid foods containing gluten sources and foods containing lactose  Call the clinic if this treatment plan is not working well for you.  Follow up in *** or sooner if needed.  Control of Mold Allergen Mold and fungi can grow on a variety of surfaces provided certain temperature and moisture conditions exist.  Outdoor molds grow on plants, decaying vegetation and soil.  The major outdoor mold, Alternaria and Cladosporium, are found in very high numbers during hot and dry conditions.  Generally, a late Summer - Fall peak is seen for common outdoor fungal spores.  Rain will temporarily lower outdoor  mold spore count, but counts rise rapidly when the rainy period ends.  The most important indoor molds are Aspergillus and Penicillium.  Dark, humid and poorly ventilated basements are ideal sites for mold growth.  The next most common sites of mold growth are the bathroom and the  kitchen.  Outdoor [employer] Use air conditioning and keep windows closed Avoid exposure to decaying vegetation. Avoid leaf raking. Avoid grain handling. Consider wearing a face mask if working in moldy areas.  Indoor Mold Control Maintain humidity below 50%. Clean washable surfaces with 5% bleach solution. Remove sources e.g. Contaminated carpets.   No follow-ups on file.    Thank you for the opportunity to care for this patient.  Please do not hesitate to contact me with questions.  Arlean Mutter, FNP [facility]             [1] Allergies Allergen Reactions   Amoxicillin Rash   Penicillins Hives  "

## 2024-03-11 NOTE — Patient Instructions (Addendum)
 Allergic rhinitis Continue allergen avoidance measures directed toward mold as listed below Begin Dymista  2 sprays in each nostril twice a day Begin levocetirizine 5 mg once a day if needed for a runny nose Consider saline nasal rinses as needed for nasal symptoms. Use this before any medicated nasal sprays for best result Consider allergen immunotherapy if your symptoms are not well-controlled with the treatment plan as listed above Consider updating your environmental allergy  testing via lab  Recurrent infection Keep track of antibiotic use, steroid use, and infections Get a pneumococcal vaccine (either PPSV23 OR Prevnar 20). Get a Tdap vaccine We will recheck the pneumococcal and diptheria titers about 4-6 weeks after you get the vaccines. Let us  know when you get these vaccines and we will order the labwork.  Food intolerance Continue to avoid foods containing gluten sources and foods containing lactose  Call the clinic if this treatment plan is not working well for you.  Follow up in 8 weeks or sooner if needed.  Control of Mold Allergen Mold and fungi can grow on a variety of surfaces provided certain temperature and moisture conditions exist.  Outdoor molds grow on plants, decaying vegetation and soil.  The major outdoor mold, Alternaria and Cladosporium, are found in very high numbers during hot and dry conditions.  Generally, a late Summer - Fall peak is seen for common outdoor fungal spores.  Rain will temporarily lower outdoor mold spore count, but counts rise rapidly when the rainy period ends.  The most important indoor molds are Aspergillus and Penicillium.  Dark, humid and poorly ventilated basements are ideal sites for mold growth.  The next most common sites of mold growth are the bathroom and the kitchen.  Outdoor [employer] Use air conditioning and keep windows closed Avoid exposure to decaying vegetation. Avoid leaf raking. Avoid grain handling. Consider wearing a  face mask if working in moldy areas.  Indoor Mold Control Maintain humidity below 50%. Clean washable surfaces with 5% bleach solution. Remove sources e.g. Contaminated carpets.

## 2024-03-13 ENCOUNTER — Encounter: Payer: Self-pay | Admitting: Family Medicine

## 2024-05-09 ENCOUNTER — Encounter: Admitting: Internal Medicine

## 2024-05-20 ENCOUNTER — Ambulatory Visit: Admitting: Family Medicine

## 2024-05-23 ENCOUNTER — Ambulatory Visit: Admitting: Family Medicine

## 2024-05-30 ENCOUNTER — Encounter: Admitting: Internal Medicine

## 2024-06-24 ENCOUNTER — Ambulatory Visit (INDEPENDENT_AMBULATORY_CARE_PROVIDER_SITE_OTHER): Admitting: Pediatrics

## 2024-06-27 ENCOUNTER — Ambulatory Visit (INDEPENDENT_AMBULATORY_CARE_PROVIDER_SITE_OTHER): Admitting: Family
